# Patient Record
Sex: Male | Born: 1950 | Race: White | Hispanic: No | Marital: Single | Smoking: Former smoker
Health system: Southern US, Community
[De-identification: ages and names within clinical notes are randomized; demographics above are authoritative.]

## PROBLEM LIST (undated history)

## (undated) DIAGNOSIS — D649 Anemia, unspecified: Secondary | ICD-10-CM

## (undated) DIAGNOSIS — E119 Type 2 diabetes mellitus without complications: Secondary | ICD-10-CM

## (undated) DIAGNOSIS — I80229 Phlebitis and thrombophlebitis of unspecified popliteal vein: Secondary | ICD-10-CM

## (undated) DIAGNOSIS — I890 Lymphedema, not elsewhere classified: Secondary | ICD-10-CM

## (undated) DIAGNOSIS — R1310 Dysphagia, unspecified: Secondary | ICD-10-CM

## (undated) DIAGNOSIS — I208 Other forms of angina pectoris: Secondary | ICD-10-CM

## (undated) DIAGNOSIS — M109 Gout, unspecified: Secondary | ICD-10-CM

## (undated) DIAGNOSIS — E079 Disorder of thyroid, unspecified: Secondary | ICD-10-CM

## (undated) DIAGNOSIS — L0390 Cellulitis, unspecified: Secondary | ICD-10-CM

## (undated) DIAGNOSIS — R0600 Dyspnea, unspecified: Secondary | ICD-10-CM

## (undated) DIAGNOSIS — K922 Gastrointestinal hemorrhage, unspecified: Secondary | ICD-10-CM

## (undated) DIAGNOSIS — I872 Venous insufficiency (chronic) (peripheral): Secondary | ICD-10-CM

---

## 2007-09-13 ENCOUNTER — Encounter: Payer: Self-pay | Admitting: Internal Medicine

## 2007-10-11 ENCOUNTER — Encounter: Payer: Self-pay | Admitting: Internal Medicine

## 2007-11-11 ENCOUNTER — Encounter: Payer: Self-pay | Admitting: Internal Medicine

## 2007-12-12 ENCOUNTER — Encounter: Payer: Self-pay | Admitting: Internal Medicine

## 2008-01-09 ENCOUNTER — Encounter: Payer: Self-pay | Admitting: Internal Medicine

## 2008-02-09 ENCOUNTER — Encounter: Payer: Self-pay | Admitting: Internal Medicine

## 2009-11-08 ENCOUNTER — Ambulatory Visit: Payer: Self-pay | Admitting: Internal Medicine

## 2009-11-12 ENCOUNTER — Ambulatory Visit: Payer: Self-pay | Admitting: Internal Medicine

## 2009-11-16 ENCOUNTER — Ambulatory Visit: Payer: Self-pay | Admitting: Internal Medicine

## 2009-11-19 ENCOUNTER — Ambulatory Visit: Payer: Self-pay | Admitting: Internal Medicine

## 2009-11-23 ENCOUNTER — Ambulatory Visit: Payer: Self-pay | Admitting: Internal Medicine

## 2009-11-27 ENCOUNTER — Ambulatory Visit: Payer: Self-pay | Admitting: Internal Medicine

## 2009-11-30 ENCOUNTER — Ambulatory Visit: Payer: Self-pay | Admitting: Internal Medicine

## 2009-12-04 ENCOUNTER — Ambulatory Visit: Payer: Self-pay | Admitting: Internal Medicine

## 2009-12-07 ENCOUNTER — Ambulatory Visit: Payer: Self-pay | Admitting: Internal Medicine

## 2009-12-12 ENCOUNTER — Ambulatory Visit: Payer: Self-pay | Admitting: Internal Medicine

## 2009-12-17 ENCOUNTER — Ambulatory Visit: Payer: Self-pay | Admitting: Internal Medicine

## 2009-12-21 ENCOUNTER — Ambulatory Visit: Payer: Self-pay | Admitting: Internal Medicine

## 2009-12-24 ENCOUNTER — Ambulatory Visit: Payer: Self-pay | Admitting: Internal Medicine

## 2009-12-28 ENCOUNTER — Ambulatory Visit: Payer: Self-pay | Admitting: Internal Medicine

## 2009-12-31 ENCOUNTER — Ambulatory Visit: Payer: Self-pay | Admitting: Internal Medicine

## 2010-01-04 ENCOUNTER — Ambulatory Visit: Payer: Self-pay | Admitting: Internal Medicine

## 2010-01-07 ENCOUNTER — Ambulatory Visit: Payer: Self-pay | Admitting: Internal Medicine

## 2010-01-11 ENCOUNTER — Ambulatory Visit: Payer: Self-pay | Admitting: Internal Medicine

## 2010-01-14 ENCOUNTER — Ambulatory Visit: Payer: Self-pay | Admitting: Internal Medicine

## 2010-01-18 ENCOUNTER — Ambulatory Visit: Payer: Self-pay | Admitting: Internal Medicine

## 2010-01-21 ENCOUNTER — Ambulatory Visit: Payer: Self-pay | Admitting: Internal Medicine

## 2010-01-28 ENCOUNTER — Ambulatory Visit: Payer: Self-pay | Admitting: Internal Medicine

## 2010-02-01 ENCOUNTER — Ambulatory Visit: Payer: Self-pay | Admitting: Internal Medicine

## 2010-02-08 ENCOUNTER — Ambulatory Visit: Payer: Self-pay | Admitting: Internal Medicine

## 2010-02-15 ENCOUNTER — Ambulatory Visit: Payer: Self-pay | Admitting: Internal Medicine

## 2010-02-22 ENCOUNTER — Ambulatory Visit: Payer: Self-pay | Admitting: Internal Medicine

## 2010-02-28 ENCOUNTER — Ambulatory Visit: Payer: Self-pay | Admitting: Internal Medicine

## 2010-03-08 ENCOUNTER — Ambulatory Visit: Payer: Self-pay | Admitting: Internal Medicine

## 2010-03-15 ENCOUNTER — Ambulatory Visit: Payer: Self-pay | Admitting: Internal Medicine

## 2010-03-18 ENCOUNTER — Ambulatory Visit: Payer: Self-pay | Admitting: Internal Medicine

## 2010-03-21 ENCOUNTER — Ambulatory Visit: Payer: Self-pay | Admitting: Internal Medicine

## 2010-03-25 ENCOUNTER — Ambulatory Visit: Payer: Self-pay | Admitting: Internal Medicine

## 2010-03-29 ENCOUNTER — Ambulatory Visit: Payer: Self-pay | Admitting: Internal Medicine

## 2010-04-05 ENCOUNTER — Ambulatory Visit: Payer: Self-pay | Admitting: Internal Medicine

## 2010-04-12 ENCOUNTER — Ambulatory Visit: Payer: Self-pay | Admitting: Internal Medicine

## 2010-04-18 ENCOUNTER — Ambulatory Visit: Payer: Self-pay | Admitting: Internal Medicine

## 2010-04-26 ENCOUNTER — Ambulatory Visit: Payer: Self-pay | Admitting: Internal Medicine

## 2010-05-03 ENCOUNTER — Ambulatory Visit: Payer: Self-pay | Admitting: Internal Medicine

## 2010-05-06 ENCOUNTER — Ambulatory Visit: Payer: Self-pay | Admitting: Internal Medicine

## 2010-05-10 ENCOUNTER — Ambulatory Visit: Payer: Self-pay | Admitting: Internal Medicine

## 2010-05-17 ENCOUNTER — Ambulatory Visit: Payer: Self-pay | Admitting: Internal Medicine

## 2010-05-24 ENCOUNTER — Ambulatory Visit: Payer: Self-pay | Admitting: Internal Medicine

## 2010-05-31 ENCOUNTER — Ambulatory Visit: Payer: Self-pay | Admitting: Internal Medicine

## 2010-06-07 ENCOUNTER — Ambulatory Visit: Payer: Self-pay | Admitting: Internal Medicine

## 2010-06-14 ENCOUNTER — Ambulatory Visit: Payer: Self-pay | Admitting: Internal Medicine

## 2010-06-21 ENCOUNTER — Ambulatory Visit: Payer: Self-pay | Admitting: Internal Medicine

## 2010-06-28 ENCOUNTER — Ambulatory Visit: Payer: Self-pay | Admitting: Internal Medicine

## 2010-07-05 ENCOUNTER — Ambulatory Visit: Payer: Self-pay | Admitting: Internal Medicine

## 2010-07-12 ENCOUNTER — Ambulatory Visit: Payer: Self-pay | Admitting: Internal Medicine

## 2010-07-19 ENCOUNTER — Ambulatory Visit: Payer: Self-pay | Admitting: Internal Medicine

## 2010-07-26 ENCOUNTER — Ambulatory Visit: Payer: Self-pay | Admitting: Internal Medicine

## 2010-08-02 ENCOUNTER — Ambulatory Visit: Payer: Self-pay | Admitting: Internal Medicine

## 2010-08-09 ENCOUNTER — Ambulatory Visit: Payer: Self-pay | Admitting: Internal Medicine

## 2010-08-16 ENCOUNTER — Ambulatory Visit: Payer: Self-pay | Admitting: Internal Medicine

## 2010-08-23 ENCOUNTER — Ambulatory Visit: Payer: Self-pay | Admitting: Internal Medicine

## 2010-08-30 ENCOUNTER — Ambulatory Visit: Payer: Self-pay | Admitting: Internal Medicine

## 2010-09-06 ENCOUNTER — Ambulatory Visit: Payer: Self-pay | Admitting: Internal Medicine

## 2010-09-13 ENCOUNTER — Ambulatory Visit: Payer: Self-pay | Admitting: Internal Medicine

## 2010-09-20 ENCOUNTER — Ambulatory Visit: Payer: Self-pay | Admitting: Internal Medicine

## 2010-09-27 ENCOUNTER — Ambulatory Visit: Payer: Self-pay | Admitting: Internal Medicine

## 2010-10-07 ENCOUNTER — Ambulatory Visit: Payer: Self-pay | Admitting: Internal Medicine

## 2010-10-08 ENCOUNTER — Ambulatory Visit: Payer: Self-pay | Admitting: Internal Medicine

## 2010-10-11 ENCOUNTER — Ambulatory Visit: Payer: Self-pay | Admitting: Internal Medicine

## 2010-10-18 ENCOUNTER — Ambulatory Visit: Payer: Self-pay | Admitting: Internal Medicine

## 2010-10-25 ENCOUNTER — Ambulatory Visit: Payer: Self-pay | Admitting: Internal Medicine

## 2010-11-01 ENCOUNTER — Ambulatory Visit: Payer: Self-pay | Admitting: Internal Medicine

## 2010-11-07 ENCOUNTER — Ambulatory Visit: Payer: Self-pay

## 2011-01-09 ENCOUNTER — Encounter: Payer: Self-pay | Admitting: Cardiothoracic Surgery

## 2011-01-09 ENCOUNTER — Encounter: Payer: Self-pay | Admitting: Nurse Practitioner

## 2011-02-09 ENCOUNTER — Encounter: Payer: Self-pay | Admitting: Nurse Practitioner

## 2011-02-09 ENCOUNTER — Encounter: Payer: Self-pay | Admitting: Cardiothoracic Surgery

## 2011-03-11 ENCOUNTER — Encounter: Payer: Self-pay | Admitting: Cardiothoracic Surgery

## 2011-04-11 ENCOUNTER — Encounter: Payer: Self-pay | Admitting: Cardiothoracic Surgery

## 2011-05-11 ENCOUNTER — Encounter: Payer: Self-pay | Admitting: Cardiothoracic Surgery

## 2011-05-11 ENCOUNTER — Encounter: Payer: Self-pay | Admitting: Nurse Practitioner

## 2013-03-07 ENCOUNTER — Ambulatory Visit: Payer: Self-pay | Admitting: Ophthalmology

## 2013-03-09 ENCOUNTER — Ambulatory Visit: Payer: Self-pay | Admitting: Ophthalmology

## 2015-03-02 NOTE — Op Note (Signed)
PATIENT NAME:  Roberto Perez, Roberto Perez MR#:  [id number] DATE OF BIRTH:  08-24-1951  DATE OF PROCEDURE:  03/09/2013  PROCEDURE PERFORMED: 1.  Pars plana vitrectomy of the left eye.  2.  Gas exchange of the left eye.  3.  Endolaser of the left eye.   PREOPERATIVE DIAGNOSIS: 1.  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   POSTOPERATIVE DIAGNOSIS: 1.  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   ESTIMATED BLOOD LOSS: Less than 1 mL.   PRIMARY SURGEON:  Garlan Fair, M.D.   ANESTHESIA:  Retrobulbar block of the left eye with monitored anesthesia care.   COMPLICATIONS:  None.   INDICATIONS FOR PROCEDURE:  The patient presented to my office with decreased vision in the left eye for approximately 2 days. Examination revealed a macula-off rhegmatogenous retinal detachment. Risks, benefits and alternatives of the above procedure were discussed, and the patient wished to proceed.   DETAILS OF PROCEDURE:  After informed consent was obtained, the patient was brought into the operative suite at [facility]. The patient was placed in supine position, was given a small dose of Alfenta and a retrobulbar block was performed on the left eye by the primary surgeon without any complications. The left eye was prepped and draped in sterile manner. After lid speculum was inserted, a 25-gauge trocar was placed inferotemporally through displaced conjunctiva in an oblique fashion 3 mm beyond the limbus. The infusion cannula was turned on and inserted through the trocar and secured in position with Steri-Strips. Two more trocars were placed in a similar fashion superotemporally and superonasally. Vitreous cutter and light pipe were introduced in the eye, and a core vitrectomy was performed. The vitreous face was confirmed as already elevated, and the peripheral vitreous was trimmed for 360 degrees. A scleral depressed exam was performed and 2 retinal breaks were identified. One at 1  o'clock and another at 4 o'clock. The vitreous cutter was utilized to amputate the flaps and trim the vitreous over the area of the retinal detachment.  Endocutter was introduced and each of the breaks were marked with endocautery. A posterior draining retinotomy was created at approximately 2:30. An air-fluid exchange was performed through the posterior draining retinotomy and the retina completely flattened. Endolaser was introduced and 4 rows of laser was placed around each of the tears and the posterior draining retinotomy after any remnant fluid was removed. Endolaser was carried for 360 degrees for 3 to 4 rows posterior to the ora serrata for 360 degrees. Once completed, any remnant fluid was removed; 22% SF6 was used as an air gas exchange, and all the trocars were removed. Two wounds were noted as being leaky and closed using single 6-0 plain gut. The eye was pressurized with the 22% SF6 to achieve a pressure of approximately 15 mmHg.  5 mg of dexamethasone was given into the inferior fornix. The lid speculum was removed, and the eye was cleaned. TobraDex was placed in the eye, and a patch and shield were placed over the eye. The patient was taken to postanesthesia care with instruction to remain on his left side for 1 hour and then turned over to stay on his right side for 2 weeks.      ____________________________ Teresa Pelton. Ova Meegan, MD mfa:dmm D: 03/09/2013 10:37:00 ET T: 03/09/2013 11:18:27 ET JOB#: [id number]  cc: Teresa Pelton. Starling Manns, MD, <Dictator> Coralee Rud MD ELECTRONICALLY SIGNED 03/25/2013 13:05

## 2016-11-10 DIAGNOSIS — R262 Difficulty in walking, not elsewhere classified: Secondary | ICD-10-CM | POA: Diagnosis not present

## 2016-11-10 DIAGNOSIS — R1310 Dysphagia, unspecified: Secondary | ICD-10-CM | POA: Diagnosis not present

## 2016-11-10 DIAGNOSIS — R41841 Cognitive communication deficit: Secondary | ICD-10-CM | POA: Diagnosis not present

## 2016-11-10 DIAGNOSIS — R1313 Dysphagia, pharyngeal phase: Secondary | ICD-10-CM | POA: Diagnosis not present

## 2016-11-10 DIAGNOSIS — K7460 Unspecified cirrhosis of liver: Secondary | ICD-10-CM | POA: Diagnosis not present

## 2016-11-10 DIAGNOSIS — Z741 Need for assistance with personal care: Secondary | ICD-10-CM | POA: Diagnosis not present

## 2016-11-10 DIAGNOSIS — D509 Iron deficiency anemia, unspecified: Secondary | ICD-10-CM | POA: Diagnosis not present

## 2016-11-10 DIAGNOSIS — M6281 Muscle weakness (generalized): Secondary | ICD-10-CM | POA: Diagnosis not present

## 2016-11-10 DIAGNOSIS — R471 Dysarthria and anarthria: Secondary | ICD-10-CM | POA: Diagnosis not present

## 2016-11-11 DIAGNOSIS — R7881 Bacteremia: Secondary | ICD-10-CM | POA: Diagnosis not present

## 2016-11-11 DIAGNOSIS — K760 Fatty (change of) liver, not elsewhere classified: Secondary | ICD-10-CM | POA: Diagnosis not present

## 2016-11-11 DIAGNOSIS — I25118 Atherosclerotic heart disease of native coronary artery with other forms of angina pectoris: Secondary | ICD-10-CM | POA: Diagnosis not present

## 2016-11-11 DIAGNOSIS — D649 Anemia, unspecified: Secondary | ICD-10-CM | POA: Diagnosis not present

## 2016-11-11 DIAGNOSIS — E039 Hypothyroidism, unspecified: Secondary | ICD-10-CM | POA: Diagnosis not present

## 2016-11-11 DIAGNOSIS — I890 Lymphedema, not elsewhere classified: Secondary | ICD-10-CM | POA: Diagnosis not present

## 2016-11-11 DIAGNOSIS — I872 Venous insufficiency (chronic) (peripheral): Secondary | ICD-10-CM | POA: Diagnosis not present

## 2016-11-25 DIAGNOSIS — I4510 Unspecified right bundle-branch block: Secondary | ICD-10-CM | POA: Diagnosis not present

## 2016-11-25 DIAGNOSIS — R001 Bradycardia, unspecified: Secondary | ICD-10-CM | POA: Diagnosis not present

## 2016-11-25 DIAGNOSIS — Z79899 Other long term (current) drug therapy: Secondary | ICD-10-CM | POA: Diagnosis not present

## 2016-11-25 DIAGNOSIS — Z8619 Personal history of other infectious and parasitic diseases: Secondary | ICD-10-CM | POA: Diagnosis not present

## 2016-11-25 DIAGNOSIS — E119 Type 2 diabetes mellitus without complications: Secondary | ICD-10-CM | POA: Diagnosis not present

## 2016-11-25 DIAGNOSIS — I440 Atrioventricular block, first degree: Secondary | ICD-10-CM | POA: Diagnosis not present

## 2016-11-25 DIAGNOSIS — K760 Fatty (change of) liver, not elsewhere classified: Secondary | ICD-10-CM | POA: Diagnosis not present

## 2016-11-25 DIAGNOSIS — Z794 Long term (current) use of insulin: Secondary | ICD-10-CM | POA: Diagnosis not present

## 2016-11-25 DIAGNOSIS — I872 Venous insufficiency (chronic) (peripheral): Secondary | ICD-10-CM | POA: Diagnosis not present

## 2016-11-25 DIAGNOSIS — I8500 Esophageal varices without bleeding: Secondary | ICD-10-CM | POA: Diagnosis not present

## 2016-11-25 DIAGNOSIS — Z8249 Family history of ischemic heart disease and other diseases of the circulatory system: Secondary | ICD-10-CM | POA: Diagnosis not present

## 2016-11-25 DIAGNOSIS — E039 Hypothyroidism, unspecified: Secondary | ICD-10-CM | POA: Diagnosis not present

## 2016-11-25 DIAGNOSIS — I208 Other forms of angina pectoris: Secondary | ICD-10-CM | POA: Diagnosis not present

## 2016-11-25 DIAGNOSIS — Z87891 Personal history of nicotine dependence: Secondary | ICD-10-CM | POA: Diagnosis not present

## 2016-11-25 DIAGNOSIS — R079 Chest pain, unspecified: Secondary | ICD-10-CM | POA: Diagnosis not present

## 2016-11-25 DIAGNOSIS — Z6830 Body mass index (BMI) 30.0-30.9, adult: Secondary | ICD-10-CM | POA: Diagnosis not present

## 2016-11-25 DIAGNOSIS — E8770 Fluid overload, unspecified: Secondary | ICD-10-CM | POA: Diagnosis not present

## 2016-11-29 DIAGNOSIS — Z87891 Personal history of nicotine dependence: Secondary | ICD-10-CM | POA: Diagnosis not present

## 2016-11-29 DIAGNOSIS — D696 Thrombocytopenia, unspecified: Secondary | ICD-10-CM | POA: Diagnosis not present

## 2016-11-29 DIAGNOSIS — R140 Abdominal distension (gaseous): Secondary | ICD-10-CM | POA: Diagnosis not present

## 2016-11-29 DIAGNOSIS — E039 Hypothyroidism, unspecified: Secondary | ICD-10-CM | POA: Diagnosis not present

## 2016-11-29 DIAGNOSIS — Z794 Long term (current) use of insulin: Secondary | ICD-10-CM | POA: Diagnosis not present

## 2016-11-29 DIAGNOSIS — R161 Splenomegaly, not elsewhere classified: Secondary | ICD-10-CM | POA: Diagnosis not present

## 2016-11-29 DIAGNOSIS — L308 Other specified dermatitis: Secondary | ICD-10-CM | POA: Diagnosis not present

## 2016-11-29 DIAGNOSIS — D62 Acute posthemorrhagic anemia: Secondary | ICD-10-CM | POA: Diagnosis not present

## 2016-11-29 DIAGNOSIS — K760 Fatty (change of) liver, not elsewhere classified: Secondary | ICD-10-CM | POA: Diagnosis not present

## 2016-11-29 DIAGNOSIS — Z79899 Other long term (current) drug therapy: Secondary | ICD-10-CM | POA: Diagnosis not present

## 2016-11-29 DIAGNOSIS — R0602 Shortness of breath: Secondary | ICD-10-CM | POA: Diagnosis not present

## 2016-11-29 DIAGNOSIS — D500 Iron deficiency anemia secondary to blood loss (chronic): Secondary | ICD-10-CM | POA: Diagnosis not present

## 2016-11-29 DIAGNOSIS — D61818 Other pancytopenia: Secondary | ICD-10-CM | POA: Diagnosis not present

## 2016-11-29 DIAGNOSIS — Z6830 Body mass index (BMI) 30.0-30.9, adult: Secondary | ICD-10-CM | POA: Diagnosis not present

## 2016-11-29 DIAGNOSIS — E119 Type 2 diabetes mellitus without complications: Secondary | ICD-10-CM | POA: Diagnosis not present

## 2016-11-29 DIAGNOSIS — K7460 Unspecified cirrhosis of liver: Secondary | ICD-10-CM | POA: Diagnosis not present

## 2016-11-29 DIAGNOSIS — I10 Essential (primary) hypertension: Secondary | ICD-10-CM | POA: Diagnosis not present

## 2016-11-29 DIAGNOSIS — N179 Acute kidney failure, unspecified: Secondary | ICD-10-CM | POA: Diagnosis not present

## 2016-11-29 DIAGNOSIS — Z8249 Family history of ischemic heart disease and other diseases of the circulatory system: Secondary | ICD-10-CM | POA: Diagnosis not present

## 2016-11-29 DIAGNOSIS — D649 Anemia, unspecified: Secondary | ICD-10-CM | POA: Diagnosis not present

## 2016-11-30 DIAGNOSIS — D62 Acute posthemorrhagic anemia: Secondary | ICD-10-CM | POA: Diagnosis not present

## 2016-11-30 DIAGNOSIS — K760 Fatty (change of) liver, not elsewhere classified: Secondary | ICD-10-CM | POA: Diagnosis not present

## 2016-11-30 DIAGNOSIS — E119 Type 2 diabetes mellitus without complications: Secondary | ICD-10-CM | POA: Diagnosis not present

## 2016-11-30 DIAGNOSIS — D61818 Other pancytopenia: Secondary | ICD-10-CM | POA: Diagnosis not present

## 2016-11-30 DIAGNOSIS — Z6830 Body mass index (BMI) 30.0-30.9, adult: Secondary | ICD-10-CM | POA: Diagnosis not present

## 2016-11-30 DIAGNOSIS — R609 Edema, unspecified: Secondary | ICD-10-CM | POA: Diagnosis not present

## 2016-11-30 DIAGNOSIS — K7460 Unspecified cirrhosis of liver: Secondary | ICD-10-CM | POA: Diagnosis not present

## 2016-11-30 DIAGNOSIS — R768 Other specified abnormal immunological findings in serum: Secondary | ICD-10-CM | POA: Diagnosis not present

## 2016-11-30 DIAGNOSIS — K922 Gastrointestinal hemorrhage, unspecified: Secondary | ICD-10-CM | POA: Diagnosis not present

## 2016-11-30 DIAGNOSIS — N179 Acute kidney failure, unspecified: Secondary | ICD-10-CM | POA: Diagnosis not present

## 2016-11-30 DIAGNOSIS — D500 Iron deficiency anemia secondary to blood loss (chronic): Secondary | ICD-10-CM | POA: Diagnosis not present

## 2016-12-03 DIAGNOSIS — K760 Fatty (change of) liver, not elsewhere classified: Secondary | ICD-10-CM | POA: Diagnosis not present

## 2016-12-03 DIAGNOSIS — M6281 Muscle weakness (generalized): Secondary | ICD-10-CM | POA: Diagnosis not present

## 2016-12-03 DIAGNOSIS — R2689 Other abnormalities of gait and mobility: Secondary | ICD-10-CM | POA: Diagnosis not present

## 2016-12-03 DIAGNOSIS — D509 Iron deficiency anemia, unspecified: Secondary | ICD-10-CM | POA: Diagnosis not present

## 2016-12-03 DIAGNOSIS — Z794 Long term (current) use of insulin: Secondary | ICD-10-CM | POA: Diagnosis not present

## 2016-12-03 DIAGNOSIS — E119 Type 2 diabetes mellitus without complications: Secondary | ICD-10-CM | POA: Diagnosis not present

## 2016-12-08 DIAGNOSIS — D509 Iron deficiency anemia, unspecified: Secondary | ICD-10-CM | POA: Diagnosis not present

## 2016-12-08 DIAGNOSIS — R2689 Other abnormalities of gait and mobility: Secondary | ICD-10-CM | POA: Diagnosis not present

## 2016-12-08 DIAGNOSIS — K760 Fatty (change of) liver, not elsewhere classified: Secondary | ICD-10-CM | POA: Diagnosis not present

## 2016-12-08 DIAGNOSIS — M6281 Muscle weakness (generalized): Secondary | ICD-10-CM | POA: Diagnosis not present

## 2016-12-08 DIAGNOSIS — Z794 Long term (current) use of insulin: Secondary | ICD-10-CM | POA: Diagnosis not present

## 2016-12-08 DIAGNOSIS — E119 Type 2 diabetes mellitus without complications: Secondary | ICD-10-CM | POA: Diagnosis not present

## 2016-12-10 DIAGNOSIS — D509 Iron deficiency anemia, unspecified: Secondary | ICD-10-CM | POA: Diagnosis not present

## 2016-12-10 DIAGNOSIS — K560 Paralytic ileus: Secondary | ICD-10-CM | POA: Diagnosis not present

## 2016-12-10 DIAGNOSIS — E119 Type 2 diabetes mellitus without complications: Secondary | ICD-10-CM | POA: Diagnosis not present

## 2016-12-11 DIAGNOSIS — R2689 Other abnormalities of gait and mobility: Secondary | ICD-10-CM | POA: Diagnosis not present

## 2016-12-11 DIAGNOSIS — Z794 Long term (current) use of insulin: Secondary | ICD-10-CM | POA: Diagnosis not present

## 2016-12-11 DIAGNOSIS — M6281 Muscle weakness (generalized): Secondary | ICD-10-CM | POA: Diagnosis not present

## 2016-12-11 DIAGNOSIS — D509 Iron deficiency anemia, unspecified: Secondary | ICD-10-CM | POA: Diagnosis not present

## 2016-12-11 DIAGNOSIS — K760 Fatty (change of) liver, not elsewhere classified: Secondary | ICD-10-CM | POA: Diagnosis not present

## 2016-12-11 DIAGNOSIS — E119 Type 2 diabetes mellitus without complications: Secondary | ICD-10-CM | POA: Diagnosis not present

## 2016-12-22 DIAGNOSIS — Z87891 Personal history of nicotine dependence: Secondary | ICD-10-CM | POA: Diagnosis not present

## 2016-12-22 DIAGNOSIS — R05 Cough: Secondary | ICD-10-CM | POA: Diagnosis not present

## 2016-12-22 DIAGNOSIS — Z794 Long term (current) use of insulin: Secondary | ICD-10-CM | POA: Diagnosis not present

## 2016-12-22 DIAGNOSIS — E039 Hypothyroidism, unspecified: Secondary | ICD-10-CM | POA: Diagnosis not present

## 2016-12-22 DIAGNOSIS — J984 Other disorders of lung: Secondary | ICD-10-CM | POA: Diagnosis not present

## 2016-12-22 DIAGNOSIS — Z7984 Long term (current) use of oral hypoglycemic drugs: Secondary | ICD-10-CM | POA: Diagnosis not present

## 2016-12-22 DIAGNOSIS — Z6831 Body mass index (BMI) 31.0-31.9, adult: Secondary | ICD-10-CM | POA: Diagnosis not present

## 2016-12-22 DIAGNOSIS — D500 Iron deficiency anemia secondary to blood loss (chronic): Secondary | ICD-10-CM | POA: Diagnosis not present

## 2016-12-22 DIAGNOSIS — I10 Essential (primary) hypertension: Secondary | ICD-10-CM | POA: Diagnosis not present

## 2016-12-22 DIAGNOSIS — K7460 Unspecified cirrhosis of liver: Secondary | ICD-10-CM | POA: Diagnosis not present

## 2016-12-22 DIAGNOSIS — K760 Fatty (change of) liver, not elsewhere classified: Secondary | ICD-10-CM | POA: Diagnosis not present

## 2016-12-22 DIAGNOSIS — Z886 Allergy status to analgesic agent status: Secondary | ICD-10-CM | POA: Diagnosis not present

## 2016-12-22 DIAGNOSIS — D62 Acute posthemorrhagic anemia: Secondary | ICD-10-CM | POA: Diagnosis not present

## 2016-12-22 DIAGNOSIS — E1122 Type 2 diabetes mellitus with diabetic chronic kidney disease: Secondary | ICD-10-CM | POA: Diagnosis not present

## 2016-12-22 DIAGNOSIS — I129 Hypertensive chronic kidney disease with stage 1 through stage 4 chronic kidney disease, or unspecified chronic kidney disease: Secondary | ICD-10-CM | POA: Diagnosis not present

## 2016-12-22 DIAGNOSIS — R531 Weakness: Secondary | ICD-10-CM | POA: Diagnosis not present

## 2016-12-22 DIAGNOSIS — Z79899 Other long term (current) drug therapy: Secondary | ICD-10-CM | POA: Diagnosis not present

## 2016-12-22 DIAGNOSIS — N179 Acute kidney failure, unspecified: Secondary | ICD-10-CM | POA: Diagnosis not present

## 2016-12-22 DIAGNOSIS — E1151 Type 2 diabetes mellitus with diabetic peripheral angiopathy without gangrene: Secondary | ICD-10-CM | POA: Diagnosis not present

## 2016-12-22 DIAGNOSIS — Z8249 Family history of ischemic heart disease and other diseases of the circulatory system: Secondary | ICD-10-CM | POA: Diagnosis not present

## 2016-12-22 DIAGNOSIS — R918 Other nonspecific abnormal finding of lung field: Secondary | ICD-10-CM | POA: Diagnosis not present

## 2016-12-22 DIAGNOSIS — N189 Chronic kidney disease, unspecified: Secondary | ICD-10-CM | POA: Diagnosis not present

## 2016-12-22 DIAGNOSIS — D631 Anemia in chronic kidney disease: Secondary | ICD-10-CM | POA: Diagnosis not present

## 2016-12-22 DIAGNOSIS — D649 Anemia, unspecified: Secondary | ICD-10-CM | POA: Diagnosis not present

## 2016-12-22 DIAGNOSIS — Z86718 Personal history of other venous thrombosis and embolism: Secondary | ICD-10-CM | POA: Diagnosis not present

## 2016-12-23 DIAGNOSIS — R188 Other ascites: Secondary | ICD-10-CM | POA: Diagnosis not present

## 2016-12-23 DIAGNOSIS — D62 Acute posthemorrhagic anemia: Secondary | ICD-10-CM | POA: Diagnosis not present

## 2016-12-23 DIAGNOSIS — I4510 Unspecified right bundle-branch block: Secondary | ICD-10-CM | POA: Diagnosis not present

## 2016-12-23 DIAGNOSIS — D509 Iron deficiency anemia, unspecified: Secondary | ICD-10-CM | POA: Diagnosis not present

## 2016-12-23 DIAGNOSIS — I491 Atrial premature depolarization: Secondary | ICD-10-CM | POA: Diagnosis not present

## 2016-12-23 DIAGNOSIS — K7469 Other cirrhosis of liver: Secondary | ICD-10-CM | POA: Diagnosis not present

## 2016-12-23 DIAGNOSIS — I440 Atrioventricular block, first degree: Secondary | ICD-10-CM | POA: Diagnosis not present

## 2016-12-23 DIAGNOSIS — K760 Fatty (change of) liver, not elsewhere classified: Secondary | ICD-10-CM | POA: Diagnosis not present

## 2016-12-23 DIAGNOSIS — N189 Chronic kidney disease, unspecified: Secondary | ICD-10-CM | POA: Diagnosis not present

## 2016-12-23 DIAGNOSIS — K7460 Unspecified cirrhosis of liver: Secondary | ICD-10-CM | POA: Diagnosis not present

## 2016-12-23 DIAGNOSIS — N179 Acute kidney failure, unspecified: Secondary | ICD-10-CM | POA: Diagnosis not present

## 2016-12-24 DIAGNOSIS — D62 Acute posthemorrhagic anemia: Secondary | ICD-10-CM | POA: Diagnosis not present

## 2016-12-24 DIAGNOSIS — D509 Iron deficiency anemia, unspecified: Secondary | ICD-10-CM | POA: Diagnosis not present

## 2016-12-24 DIAGNOSIS — D649 Anemia, unspecified: Secondary | ICD-10-CM | POA: Diagnosis not present

## 2016-12-24 DIAGNOSIS — K7469 Other cirrhosis of liver: Secondary | ICD-10-CM | POA: Diagnosis not present

## 2016-12-26 DIAGNOSIS — E119 Type 2 diabetes mellitus without complications: Secondary | ICD-10-CM | POA: Diagnosis not present

## 2016-12-26 DIAGNOSIS — Z794 Long term (current) use of insulin: Secondary | ICD-10-CM | POA: Diagnosis not present

## 2016-12-26 DIAGNOSIS — K760 Fatty (change of) liver, not elsewhere classified: Secondary | ICD-10-CM | POA: Diagnosis not present

## 2016-12-26 DIAGNOSIS — D509 Iron deficiency anemia, unspecified: Secondary | ICD-10-CM | POA: Diagnosis not present

## 2016-12-26 DIAGNOSIS — M6281 Muscle weakness (generalized): Secondary | ICD-10-CM | POA: Diagnosis not present

## 2016-12-26 DIAGNOSIS — R2689 Other abnormalities of gait and mobility: Secondary | ICD-10-CM | POA: Diagnosis not present

## 2016-12-29 DIAGNOSIS — D500 Iron deficiency anemia secondary to blood loss (chronic): Secondary | ICD-10-CM | POA: Diagnosis not present

## 2016-12-29 DIAGNOSIS — R040 Epistaxis: Secondary | ICD-10-CM | POA: Diagnosis not present

## 2016-12-29 DIAGNOSIS — I10 Essential (primary) hypertension: Secondary | ICD-10-CM | POA: Diagnosis not present

## 2016-12-29 DIAGNOSIS — E119 Type 2 diabetes mellitus without complications: Secondary | ICD-10-CM | POA: Diagnosis not present

## 2016-12-29 DIAGNOSIS — Z8249 Family history of ischemic heart disease and other diseases of the circulatory system: Secondary | ICD-10-CM | POA: Diagnosis not present

## 2016-12-29 DIAGNOSIS — K921 Melena: Secondary | ICD-10-CM | POA: Diagnosis not present

## 2016-12-29 DIAGNOSIS — E039 Hypothyroidism, unspecified: Secondary | ICD-10-CM | POA: Diagnosis not present

## 2016-12-29 DIAGNOSIS — Z79899 Other long term (current) drug therapy: Secondary | ICD-10-CM | POA: Diagnosis not present

## 2016-12-29 DIAGNOSIS — R600 Localized edema: Secondary | ICD-10-CM | POA: Diagnosis not present

## 2017-01-02 DIAGNOSIS — E119 Type 2 diabetes mellitus without complications: Secondary | ICD-10-CM | POA: Diagnosis not present

## 2017-01-02 DIAGNOSIS — M6281 Muscle weakness (generalized): Secondary | ICD-10-CM | POA: Diagnosis not present

## 2017-01-02 DIAGNOSIS — Z794 Long term (current) use of insulin: Secondary | ICD-10-CM | POA: Diagnosis not present

## 2017-01-02 DIAGNOSIS — R2689 Other abnormalities of gait and mobility: Secondary | ICD-10-CM | POA: Diagnosis not present

## 2017-01-02 DIAGNOSIS — D509 Iron deficiency anemia, unspecified: Secondary | ICD-10-CM | POA: Diagnosis not present

## 2017-01-02 DIAGNOSIS — K760 Fatty (change of) liver, not elsewhere classified: Secondary | ICD-10-CM | POA: Diagnosis not present

## 2017-01-08 DIAGNOSIS — R2689 Other abnormalities of gait and mobility: Secondary | ICD-10-CM | POA: Diagnosis not present

## 2017-01-08 DIAGNOSIS — M6281 Muscle weakness (generalized): Secondary | ICD-10-CM | POA: Diagnosis not present

## 2017-01-08 DIAGNOSIS — E119 Type 2 diabetes mellitus without complications: Secondary | ICD-10-CM | POA: Diagnosis not present

## 2017-01-08 DIAGNOSIS — Z794 Long term (current) use of insulin: Secondary | ICD-10-CM | POA: Diagnosis not present

## 2017-01-08 DIAGNOSIS — D509 Iron deficiency anemia, unspecified: Secondary | ICD-10-CM | POA: Diagnosis not present

## 2017-01-08 DIAGNOSIS — K760 Fatty (change of) liver, not elsewhere classified: Secondary | ICD-10-CM | POA: Diagnosis not present

## 2017-01-09 DIAGNOSIS — E119 Type 2 diabetes mellitus without complications: Secondary | ICD-10-CM | POA: Diagnosis not present

## 2017-01-09 DIAGNOSIS — K760 Fatty (change of) liver, not elsewhere classified: Secondary | ICD-10-CM | POA: Diagnosis not present

## 2017-01-09 DIAGNOSIS — D509 Iron deficiency anemia, unspecified: Secondary | ICD-10-CM | POA: Diagnosis not present

## 2017-01-09 DIAGNOSIS — Z794 Long term (current) use of insulin: Secondary | ICD-10-CM | POA: Diagnosis not present

## 2017-01-09 DIAGNOSIS — R2689 Other abnormalities of gait and mobility: Secondary | ICD-10-CM | POA: Diagnosis not present

## 2017-01-09 DIAGNOSIS — M6281 Muscle weakness (generalized): Secondary | ICD-10-CM | POA: Diagnosis not present

## 2017-01-12 DIAGNOSIS — D509 Iron deficiency anemia, unspecified: Secondary | ICD-10-CM | POA: Diagnosis not present

## 2017-01-12 DIAGNOSIS — R2689 Other abnormalities of gait and mobility: Secondary | ICD-10-CM | POA: Diagnosis not present

## 2017-01-12 DIAGNOSIS — M6281 Muscle weakness (generalized): Secondary | ICD-10-CM | POA: Diagnosis not present

## 2017-01-12 DIAGNOSIS — K760 Fatty (change of) liver, not elsewhere classified: Secondary | ICD-10-CM | POA: Diagnosis not present

## 2017-01-12 DIAGNOSIS — Z794 Long term (current) use of insulin: Secondary | ICD-10-CM | POA: Diagnosis not present

## 2017-01-12 DIAGNOSIS — E119 Type 2 diabetes mellitus without complications: Secondary | ICD-10-CM | POA: Diagnosis not present

## 2017-01-20 DIAGNOSIS — M6281 Muscle weakness (generalized): Secondary | ICD-10-CM | POA: Diagnosis not present

## 2017-01-20 DIAGNOSIS — Z794 Long term (current) use of insulin: Secondary | ICD-10-CM | POA: Diagnosis not present

## 2017-01-20 DIAGNOSIS — D509 Iron deficiency anemia, unspecified: Secondary | ICD-10-CM | POA: Diagnosis not present

## 2017-01-20 DIAGNOSIS — K760 Fatty (change of) liver, not elsewhere classified: Secondary | ICD-10-CM | POA: Diagnosis not present

## 2017-01-20 DIAGNOSIS — E119 Type 2 diabetes mellitus without complications: Secondary | ICD-10-CM | POA: Diagnosis not present

## 2017-01-20 DIAGNOSIS — R2689 Other abnormalities of gait and mobility: Secondary | ICD-10-CM | POA: Diagnosis not present

## 2017-02-04 DIAGNOSIS — M549 Dorsalgia, unspecified: Secondary | ICD-10-CM | POA: Diagnosis not present

## 2017-02-04 DIAGNOSIS — K766 Portal hypertension: Secondary | ICD-10-CM | POA: Diagnosis not present

## 2017-02-04 DIAGNOSIS — R0602 Shortness of breath: Secondary | ICD-10-CM | POA: Diagnosis not present

## 2017-02-04 DIAGNOSIS — N179 Acute kidney failure, unspecified: Secondary | ICD-10-CM | POA: Diagnosis not present

## 2017-02-04 DIAGNOSIS — R9431 Abnormal electrocardiogram [ECG] [EKG]: Secondary | ICD-10-CM | POA: Diagnosis not present

## 2017-02-04 DIAGNOSIS — R601 Generalized edema: Secondary | ICD-10-CM | POA: Diagnosis not present

## 2017-02-04 DIAGNOSIS — N5089 Other specified disorders of the male genital organs: Secondary | ICD-10-CM | POA: Diagnosis not present

## 2017-02-04 DIAGNOSIS — N183 Chronic kidney disease, stage 3 (moderate): Secondary | ICD-10-CM | POA: Diagnosis not present

## 2017-02-04 DIAGNOSIS — D61818 Other pancytopenia: Secondary | ICD-10-CM | POA: Diagnosis not present

## 2017-02-04 DIAGNOSIS — I4510 Unspecified right bundle-branch block: Secondary | ICD-10-CM | POA: Diagnosis not present

## 2017-02-04 DIAGNOSIS — E875 Hyperkalemia: Secondary | ICD-10-CM | POA: Diagnosis not present

## 2017-02-04 DIAGNOSIS — M109 Gout, unspecified: Secondary | ICD-10-CM | POA: Diagnosis not present

## 2017-02-04 DIAGNOSIS — I129 Hypertensive chronic kidney disease with stage 1 through stage 4 chronic kidney disease, or unspecified chronic kidney disease: Secondary | ICD-10-CM | POA: Diagnosis not present

## 2017-02-04 DIAGNOSIS — K7469 Other cirrhosis of liver: Secondary | ICD-10-CM | POA: Diagnosis not present

## 2017-02-04 DIAGNOSIS — Z6832 Body mass index (BMI) 32.0-32.9, adult: Secondary | ICD-10-CM | POA: Diagnosis not present

## 2017-02-04 DIAGNOSIS — E039 Hypothyroidism, unspecified: Secondary | ICD-10-CM | POA: Diagnosis not present

## 2017-02-04 DIAGNOSIS — K760 Fatty (change of) liver, not elsewhere classified: Secondary | ICD-10-CM | POA: Diagnosis not present

## 2017-02-04 DIAGNOSIS — K7581 Nonalcoholic steatohepatitis (NASH): Secondary | ICD-10-CM | POA: Diagnosis not present

## 2017-02-04 DIAGNOSIS — Z86718 Personal history of other venous thrombosis and embolism: Secondary | ICD-10-CM | POA: Diagnosis not present

## 2017-02-04 DIAGNOSIS — R188 Other ascites: Secondary | ICD-10-CM | POA: Diagnosis not present

## 2017-02-04 DIAGNOSIS — I440 Atrioventricular block, first degree: Secondary | ICD-10-CM | POA: Diagnosis not present

## 2017-02-04 DIAGNOSIS — L89212 Pressure ulcer of right hip, stage 2: Secondary | ICD-10-CM | POA: Diagnosis not present

## 2017-02-04 DIAGNOSIS — L8945 Pressure ulcer of contiguous site of back, buttock and hip, unstageable: Secondary | ICD-10-CM | POA: Diagnosis not present

## 2017-02-04 DIAGNOSIS — D500 Iron deficiency anemia secondary to blood loss (chronic): Secondary | ICD-10-CM | POA: Diagnosis not present

## 2017-02-04 DIAGNOSIS — K922 Gastrointestinal hemorrhage, unspecified: Secondary | ICD-10-CM | POA: Diagnosis not present

## 2017-02-04 DIAGNOSIS — I872 Venous insufficiency (chronic) (peripheral): Secondary | ICD-10-CM | POA: Diagnosis not present

## 2017-02-04 DIAGNOSIS — K7290 Hepatic failure, unspecified without coma: Secondary | ICD-10-CM | POA: Diagnosis not present

## 2017-02-04 DIAGNOSIS — D72819 Decreased white blood cell count, unspecified: Secondary | ICD-10-CM | POA: Diagnosis not present

## 2017-02-04 DIAGNOSIS — I871 Compression of vein: Secondary | ICD-10-CM | POA: Diagnosis not present

## 2017-02-04 DIAGNOSIS — D649 Anemia, unspecified: Secondary | ICD-10-CM | POA: Diagnosis not present

## 2017-02-04 DIAGNOSIS — R918 Other nonspecific abnormal finding of lung field: Secondary | ICD-10-CM | POA: Diagnosis not present

## 2017-02-04 DIAGNOSIS — E1151 Type 2 diabetes mellitus with diabetic peripheral angiopathy without gangrene: Secondary | ICD-10-CM | POA: Diagnosis not present

## 2017-02-04 DIAGNOSIS — E8770 Fluid overload, unspecified: Secondary | ICD-10-CM | POA: Diagnosis not present

## 2017-02-04 DIAGNOSIS — E1122 Type 2 diabetes mellitus with diabetic chronic kidney disease: Secondary | ICD-10-CM | POA: Diagnosis not present

## 2017-02-04 DIAGNOSIS — Z794 Long term (current) use of insulin: Secondary | ICD-10-CM | POA: Diagnosis not present

## 2017-02-05 DIAGNOSIS — D649 Anemia, unspecified: Secondary | ICD-10-CM | POA: Diagnosis not present

## 2017-02-05 DIAGNOSIS — I440 Atrioventricular block, first degree: Secondary | ICD-10-CM | POA: Diagnosis not present

## 2017-02-05 DIAGNOSIS — R188 Other ascites: Secondary | ICD-10-CM | POA: Diagnosis not present

## 2017-02-05 DIAGNOSIS — K7469 Other cirrhosis of liver: Secondary | ICD-10-CM | POA: Diagnosis not present

## 2017-02-05 DIAGNOSIS — K7581 Nonalcoholic steatohepatitis (NASH): Secondary | ICD-10-CM | POA: Diagnosis not present

## 2017-02-05 DIAGNOSIS — E875 Hyperkalemia: Secondary | ICD-10-CM | POA: Diagnosis not present

## 2017-02-05 DIAGNOSIS — R601 Generalized edema: Secondary | ICD-10-CM | POA: Diagnosis not present

## 2017-02-05 DIAGNOSIS — I4510 Unspecified right bundle-branch block: Secondary | ICD-10-CM | POA: Diagnosis not present

## 2017-02-05 DIAGNOSIS — R9431 Abnormal electrocardiogram [ECG] [EKG]: Secondary | ICD-10-CM | POA: Diagnosis not present

## 2017-02-06 DIAGNOSIS — K7469 Other cirrhosis of liver: Secondary | ICD-10-CM | POA: Diagnosis not present

## 2017-02-06 DIAGNOSIS — K7581 Nonalcoholic steatohepatitis (NASH): Secondary | ICD-10-CM | POA: Diagnosis not present

## 2017-02-06 DIAGNOSIS — K7460 Unspecified cirrhosis of liver: Secondary | ICD-10-CM | POA: Diagnosis not present

## 2017-02-06 DIAGNOSIS — R601 Generalized edema: Secondary | ICD-10-CM | POA: Diagnosis not present

## 2017-02-06 DIAGNOSIS — Z9889 Other specified postprocedural states: Secondary | ICD-10-CM | POA: Diagnosis not present

## 2017-02-06 DIAGNOSIS — K766 Portal hypertension: Secondary | ICD-10-CM | POA: Diagnosis not present

## 2017-02-06 DIAGNOSIS — D509 Iron deficiency anemia, unspecified: Secondary | ICD-10-CM | POA: Diagnosis not present

## 2017-02-06 DIAGNOSIS — D649 Anemia, unspecified: Secondary | ICD-10-CM | POA: Diagnosis not present

## 2017-02-06 DIAGNOSIS — R161 Splenomegaly, not elsewhere classified: Secondary | ICD-10-CM | POA: Diagnosis not present

## 2017-02-06 DIAGNOSIS — E119 Type 2 diabetes mellitus without complications: Secondary | ICD-10-CM | POA: Diagnosis not present

## 2017-02-06 DIAGNOSIS — D500 Iron deficiency anemia secondary to blood loss (chronic): Secondary | ICD-10-CM | POA: Diagnosis not present

## 2017-02-06 DIAGNOSIS — R188 Other ascites: Secondary | ICD-10-CM | POA: Diagnosis not present

## 2017-02-06 DIAGNOSIS — K922 Gastrointestinal hemorrhage, unspecified: Secondary | ICD-10-CM | POA: Diagnosis not present

## 2017-02-07 DIAGNOSIS — D509 Iron deficiency anemia, unspecified: Secondary | ICD-10-CM | POA: Diagnosis not present

## 2017-02-07 DIAGNOSIS — K7469 Other cirrhosis of liver: Secondary | ICD-10-CM | POA: Diagnosis not present

## 2017-02-07 DIAGNOSIS — R188 Other ascites: Secondary | ICD-10-CM | POA: Diagnosis not present

## 2017-02-07 DIAGNOSIS — R601 Generalized edema: Secondary | ICD-10-CM | POA: Diagnosis not present

## 2017-02-07 DIAGNOSIS — D649 Anemia, unspecified: Secondary | ICD-10-CM | POA: Diagnosis not present

## 2017-02-07 DIAGNOSIS — K7581 Nonalcoholic steatohepatitis (NASH): Secondary | ICD-10-CM | POA: Diagnosis not present

## 2017-02-07 DIAGNOSIS — E119 Type 2 diabetes mellitus without complications: Secondary | ICD-10-CM | POA: Diagnosis not present

## 2017-02-08 DIAGNOSIS — E119 Type 2 diabetes mellitus without complications: Secondary | ICD-10-CM | POA: Diagnosis not present

## 2017-02-08 DIAGNOSIS — R188 Other ascites: Secondary | ICD-10-CM | POA: Diagnosis not present

## 2017-02-08 DIAGNOSIS — D509 Iron deficiency anemia, unspecified: Secondary | ICD-10-CM | POA: Diagnosis not present

## 2017-02-08 DIAGNOSIS — K7581 Nonalcoholic steatohepatitis (NASH): Secondary | ICD-10-CM | POA: Diagnosis not present

## 2017-02-08 DIAGNOSIS — R601 Generalized edema: Secondary | ICD-10-CM | POA: Diagnosis not present

## 2017-02-09 DIAGNOSIS — K7581 Nonalcoholic steatohepatitis (NASH): Secondary | ICD-10-CM | POA: Diagnosis not present

## 2017-02-09 DIAGNOSIS — R188 Other ascites: Secondary | ICD-10-CM | POA: Diagnosis not present

## 2017-02-09 DIAGNOSIS — D509 Iron deficiency anemia, unspecified: Secondary | ICD-10-CM | POA: Diagnosis not present

## 2017-02-09 DIAGNOSIS — E119 Type 2 diabetes mellitus without complications: Secondary | ICD-10-CM | POA: Diagnosis not present

## 2017-02-09 DIAGNOSIS — R601 Generalized edema: Secondary | ICD-10-CM | POA: Diagnosis not present

## 2017-02-10 DIAGNOSIS — Z741 Need for assistance with personal care: Secondary | ICD-10-CM | POA: Diagnosis not present

## 2017-02-10 DIAGNOSIS — E119 Type 2 diabetes mellitus without complications: Secondary | ICD-10-CM | POA: Diagnosis not present

## 2017-02-10 DIAGNOSIS — E8770 Fluid overload, unspecified: Secondary | ICD-10-CM | POA: Diagnosis not present

## 2017-02-10 DIAGNOSIS — N3000 Acute cystitis without hematuria: Secondary | ICD-10-CM | POA: Diagnosis not present

## 2017-02-10 DIAGNOSIS — D638 Anemia in other chronic diseases classified elsewhere: Secondary | ICD-10-CM | POA: Diagnosis not present

## 2017-02-10 DIAGNOSIS — R262 Difficulty in walking, not elsewhere classified: Secondary | ICD-10-CM | POA: Diagnosis not present

## 2017-02-10 DIAGNOSIS — G9340 Encephalopathy, unspecified: Secondary | ICD-10-CM | POA: Diagnosis not present

## 2017-02-10 DIAGNOSIS — N179 Acute kidney failure, unspecified: Secondary | ICD-10-CM | POA: Diagnosis not present

## 2017-02-10 DIAGNOSIS — R1310 Dysphagia, unspecified: Secondary | ICD-10-CM | POA: Diagnosis not present

## 2017-02-10 DIAGNOSIS — M6281 Muscle weakness (generalized): Secondary | ICD-10-CM | POA: Diagnosis not present

## 2017-02-10 DIAGNOSIS — R2681 Unsteadiness on feet: Secondary | ICD-10-CM | POA: Diagnosis not present

## 2017-02-10 DIAGNOSIS — K7460 Unspecified cirrhosis of liver: Secondary | ICD-10-CM | POA: Diagnosis not present

## 2017-02-10 DIAGNOSIS — D500 Iron deficiency anemia secondary to blood loss (chronic): Secondary | ICD-10-CM | POA: Diagnosis not present

## 2017-02-10 DIAGNOSIS — K766 Portal hypertension: Secondary | ICD-10-CM | POA: Diagnosis not present

## 2017-02-10 DIAGNOSIS — I872 Venous insufficiency (chronic) (peripheral): Secondary | ICD-10-CM | POA: Diagnosis not present

## 2017-02-12 ENCOUNTER — Inpatient Hospital Stay
Admission: EM | Admit: 2017-02-12 | Discharge: 2017-02-17 | DRG: 871 | Disposition: A | Discharge status: Skilled Nursing Facility | Payer: PPO | Attending: Internal Medicine | Admitting: Internal Medicine

## 2017-02-12 ENCOUNTER — Encounter: Payer: Self-pay | Admitting: Emergency Medicine

## 2017-02-12 DIAGNOSIS — A409 Streptococcal sepsis, unspecified: Secondary | ICD-10-CM | POA: Diagnosis present

## 2017-02-12 DIAGNOSIS — E039 Hypothyroidism, unspecified: Secondary | ICD-10-CM | POA: Diagnosis not present

## 2017-02-12 DIAGNOSIS — Z7401 Bed confinement status: Secondary | ICD-10-CM | POA: Diagnosis not present

## 2017-02-12 DIAGNOSIS — K7460 Unspecified cirrhosis of liver: Secondary | ICD-10-CM | POA: Diagnosis not present

## 2017-02-12 DIAGNOSIS — R609 Edema, unspecified: Secondary | ICD-10-CM

## 2017-02-12 DIAGNOSIS — K767 Hepatorenal syndrome: Secondary | ICD-10-CM | POA: Diagnosis not present

## 2017-02-12 DIAGNOSIS — G9341 Metabolic encephalopathy: Secondary | ICD-10-CM | POA: Diagnosis not present

## 2017-02-12 DIAGNOSIS — N3000 Acute cystitis without hematuria: Secondary | ICD-10-CM | POA: Diagnosis not present

## 2017-02-12 DIAGNOSIS — D631 Anemia in chronic kidney disease: Secondary | ICD-10-CM | POA: Diagnosis present

## 2017-02-12 DIAGNOSIS — A401 Sepsis due to streptococcus, group B: Secondary | ICD-10-CM | POA: Diagnosis not present

## 2017-02-12 DIAGNOSIS — I82533 Chronic embolism and thrombosis of popliteal vein, bilateral: Secondary | ICD-10-CM | POA: Diagnosis present

## 2017-02-12 DIAGNOSIS — N183 Chronic kidney disease, stage 3 (moderate): Secondary | ICD-10-CM | POA: Diagnosis present

## 2017-02-12 DIAGNOSIS — Z794 Long term (current) use of insulin: Secondary | ICD-10-CM

## 2017-02-12 DIAGNOSIS — D61818 Other pancytopenia: Secondary | ICD-10-CM | POA: Diagnosis not present

## 2017-02-12 DIAGNOSIS — K652 Spontaneous bacterial peritonitis: Secondary | ICD-10-CM | POA: Diagnosis not present

## 2017-02-12 DIAGNOSIS — R509 Fever, unspecified: Secondary | ICD-10-CM | POA: Diagnosis not present

## 2017-02-12 DIAGNOSIS — I82513 Chronic embolism and thrombosis of femoral vein, bilateral: Secondary | ICD-10-CM | POA: Diagnosis not present

## 2017-02-12 DIAGNOSIS — D638 Anemia in other chronic diseases classified elsewhere: Secondary | ICD-10-CM | POA: Diagnosis not present

## 2017-02-12 DIAGNOSIS — R1310 Dysphagia, unspecified: Secondary | ICD-10-CM | POA: Diagnosis not present

## 2017-02-12 DIAGNOSIS — N179 Acute kidney failure, unspecified: Secondary | ICD-10-CM

## 2017-02-12 DIAGNOSIS — K766 Portal hypertension: Secondary | ICD-10-CM | POA: Diagnosis present

## 2017-02-12 DIAGNOSIS — I82449 Acute embolism and thrombosis of unspecified tibial vein: Secondary | ICD-10-CM | POA: Diagnosis not present

## 2017-02-12 DIAGNOSIS — K7210 Chronic hepatic failure without coma: Secondary | ICD-10-CM

## 2017-02-12 DIAGNOSIS — L8990 Pressure ulcer of unspecified site, unspecified stage: Secondary | ICD-10-CM | POA: Diagnosis present

## 2017-02-12 DIAGNOSIS — R2681 Unsteadiness on feet: Secondary | ICD-10-CM | POA: Diagnosis not present

## 2017-02-12 DIAGNOSIS — K7290 Hepatic failure, unspecified without coma: Secondary | ICD-10-CM

## 2017-02-12 DIAGNOSIS — E875 Hyperkalemia: Secondary | ICD-10-CM | POA: Diagnosis present

## 2017-02-12 DIAGNOSIS — L309 Dermatitis, unspecified: Secondary | ICD-10-CM | POA: Diagnosis present

## 2017-02-12 DIAGNOSIS — R188 Other ascites: Secondary | ICD-10-CM

## 2017-02-12 DIAGNOSIS — K7581 Nonalcoholic steatohepatitis (NASH): Secondary | ICD-10-CM | POA: Diagnosis not present

## 2017-02-12 DIAGNOSIS — E1122 Type 2 diabetes mellitus with diabetic chronic kidney disease: Secondary | ICD-10-CM | POA: Diagnosis present

## 2017-02-12 DIAGNOSIS — N390 Urinary tract infection, site not specified: Secondary | ICD-10-CM | POA: Diagnosis not present

## 2017-02-12 DIAGNOSIS — K7469 Other cirrhosis of liver: Secondary | ICD-10-CM | POA: Diagnosis present

## 2017-02-12 DIAGNOSIS — E860 Dehydration: Secondary | ICD-10-CM | POA: Diagnosis present

## 2017-02-12 DIAGNOSIS — L97429 Non-pressure chronic ulcer of left heel and midfoot with unspecified severity: Secondary | ICD-10-CM | POA: Diagnosis not present

## 2017-02-12 DIAGNOSIS — R7881 Bacteremia: Secondary | ICD-10-CM

## 2017-02-12 DIAGNOSIS — Z79899 Other long term (current) drug therapy: Secondary | ICD-10-CM

## 2017-02-12 DIAGNOSIS — Z515 Encounter for palliative care: Secondary | ICD-10-CM

## 2017-02-12 DIAGNOSIS — G939 Disorder of brain, unspecified: Secondary | ICD-10-CM | POA: Diagnosis not present

## 2017-02-12 DIAGNOSIS — G9340 Encephalopathy, unspecified: Secondary | ICD-10-CM

## 2017-02-12 DIAGNOSIS — M6281 Muscle weakness (generalized): Secondary | ICD-10-CM | POA: Diagnosis not present

## 2017-02-12 DIAGNOSIS — E1151 Type 2 diabetes mellitus with diabetic peripheral angiopathy without gangrene: Secondary | ICD-10-CM | POA: Diagnosis present

## 2017-02-12 DIAGNOSIS — D500 Iron deficiency anemia secondary to blood loss (chronic): Secondary | ICD-10-CM | POA: Diagnosis not present

## 2017-02-12 DIAGNOSIS — Z741 Need for assistance with personal care: Secondary | ICD-10-CM | POA: Diagnosis not present

## 2017-02-12 DIAGNOSIS — A419 Sepsis, unspecified organism: Secondary | ICD-10-CM | POA: Diagnosis not present

## 2017-02-12 DIAGNOSIS — Z66 Do not resuscitate: Secondary | ICD-10-CM

## 2017-02-12 DIAGNOSIS — A4102 Sepsis due to Methicillin resistant Staphylococcus aureus: Secondary | ICD-10-CM | POA: Diagnosis not present

## 2017-02-12 DIAGNOSIS — R262 Difficulty in walking, not elsewhere classified: Secondary | ICD-10-CM | POA: Diagnosis not present

## 2017-02-12 DIAGNOSIS — D649 Anemia, unspecified: Secondary | ICD-10-CM | POA: Diagnosis not present

## 2017-02-12 DIAGNOSIS — G9349 Other encephalopathy: Secondary | ICD-10-CM | POA: Diagnosis not present

## 2017-02-12 DIAGNOSIS — M7989 Other specified soft tissue disorders: Secondary | ICD-10-CM | POA: Diagnosis not present

## 2017-02-12 DIAGNOSIS — Z87891 Personal history of nicotine dependence: Secondary | ICD-10-CM

## 2017-02-12 HISTORY — DX: Anemia, unspecified: D64.9

## 2017-02-12 LAB — GLUCOSE, CAPILLARY
GLUCOSE-CAPILLARY: 162 mg/dL — AB (ref 65–99)
GLUCOSE-CAPILLARY: 122 mg/dL — AB (ref 65–99)
GLUCOSE-CAPILLARY: 137 mg/dL — AB (ref 65–99)
Glucose-Capillary: 133 mg/dL — ABNORMAL HIGH (ref 65–99)
Glucose-Capillary: 155 mg/dL — ABNORMAL HIGH (ref 65–99)

## 2017-02-12 LAB — COMPREHENSIVE METABOLIC PANEL
ALK PHOS: 116 U/L (ref 38–126)
ALT: 13 U/L — AB (ref 17–63)
AST: 18 U/L (ref 15–41)
Albumin: 2.6 g/dL — ABNORMAL LOW (ref 3.5–5.0)
Anion gap: 7 (ref 5–15)
BUN: 78 mg/dL — ABNORMAL HIGH (ref 6–20)
CALCIUM: 8.7 mg/dL — AB (ref 8.9–10.3)
CO2: 27 mmol/L (ref 22–32)
CREATININE: 2.98 mg/dL — AB (ref 0.61–1.24)
Chloride: 103 mmol/L (ref 101–111)
GFR calc non Af Amer: 20 mL/min — ABNORMAL LOW (ref >60)
GFR, EST AFRICAN AMERICAN: 24 mL/min — AB (ref >60)
Glucose, Bld: 157 mg/dL — ABNORMAL HIGH (ref 65–99)
Potassium: 4 mmol/L (ref 3.5–5.1)
Sodium: 137 mmol/L (ref 135–145)
Total Bilirubin: 1.3 mg/dL — ABNORMAL HIGH (ref 0.3–1.2)
Total Protein: 5.9 g/dL — ABNORMAL LOW (ref 6.5–8.1)

## 2017-02-12 LAB — URINALYSIS, COMPLETE (UACMP) WITH MICROSCOPIC
BILIRUBIN URINE: NEGATIVE
GLUCOSE, UA: NEGATIVE mg/dL
KETONES UR: NEGATIVE mg/dL
NITRITE: NEGATIVE
PH: 5 (ref 5.0–8.0)
PROTEIN: 30 mg/dL — AB
Specific Gravity, Urine: 1.012 (ref 1.005–1.030)
Squamous Epithelial / HPF: NONE SEEN

## 2017-02-12 LAB — BLOOD CULTURE ID PANEL (REFLEXED)
ACINETOBACTER BAUMANNII: NOT DETECTED
CANDIDA ALBICANS: NOT DETECTED
CANDIDA KRUSEI: NOT DETECTED
CANDIDA PARAPSILOSIS: NOT DETECTED
Candida glabrata: NOT DETECTED
Candida tropicalis: NOT DETECTED
ENTEROBACTER CLOACAE COMPLEX: NOT DETECTED
ENTEROBACTERIACEAE SPECIES: NOT DETECTED
ENTEROCOCCUS SPECIES: NOT DETECTED
ESCHERICHIA COLI: NOT DETECTED
Haemophilus influenzae: NOT DETECTED
KLEBSIELLA OXYTOCA: NOT DETECTED
Klebsiella pneumoniae: NOT DETECTED
LISTERIA MONOCYTOGENES: NOT DETECTED
Methicillin resistance: DETECTED — AB
Neisseria meningitidis: NOT DETECTED
PSEUDOMONAS AERUGINOSA: NOT DETECTED
Proteus species: NOT DETECTED
SERRATIA MARCESCENS: NOT DETECTED
STAPHYLOCOCCUS AUREUS BCID: DETECTED — AB
STREPTOCOCCUS AGALACTIAE: DETECTED — AB
STREPTOCOCCUS PNEUMONIAE: NOT DETECTED
Staphylococcus species: DETECTED — AB
Streptococcus pyogenes: NOT DETECTED
Streptococcus species: DETECTED — AB

## 2017-02-12 LAB — CBC
HCT: 24 % — ABNORMAL LOW (ref 40.0–52.0)
HEMOGLOBIN: 7.7 g/dL — AB (ref 13.0–18.0)
MCH: 31.1 pg (ref 26.0–34.0)
MCHC: 32.3 g/dL (ref 32.0–36.0)
MCV: 96.2 fL (ref 80.0–100.0)
PLATELETS: 60 10*3/uL — AB (ref 150–440)
RBC: 2.49 MIL/uL — AB (ref 4.40–5.90)
RDW: 24.4 % — ABNORMAL HIGH (ref 11.5–14.5)
WBC: 3.8 10*3/uL (ref 3.8–10.6)

## 2017-02-12 LAB — LACTIC ACID, PLASMA: Lactic Acid, Venous: 1.2 mmol/L (ref 0.5–1.9)

## 2017-02-12 LAB — MRSA PCR SCREENING: MRSA by PCR: NEGATIVE

## 2017-02-12 LAB — AMMONIA: Ammonia: 163 umol/L — ABNORMAL HIGH (ref 9–35)

## 2017-02-12 LAB — PROTIME-INR
INR: 1.63
Prothrombin Time: 19.5 seconds — ABNORMAL HIGH (ref 11.4–15.2)

## 2017-02-12 MED ORDER — INSULIN ASPART 100 UNIT/ML ~~LOC~~ SOLN
0.0000 [IU] | Freq: Three times a day (TID) | SUBCUTANEOUS | Status: DC
  Administered 2017-02-12: 1 [IU] via SUBCUTANEOUS
  Administered 2017-02-12: 2 [IU] via SUBCUTANEOUS
  Administered 2017-02-12 – 2017-02-13 (×2): 1 [IU] via SUBCUTANEOUS
  Administered 2017-02-13 – 2017-02-14 (×2): 3 [IU] via SUBCUTANEOUS
  Administered 2017-02-14: 7 [IU] via SUBCUTANEOUS
  Administered 2017-02-14 – 2017-02-15 (×2): 5 [IU] via SUBCUTANEOUS
  Administered 2017-02-15 (×2): 3 [IU] via SUBCUTANEOUS
  Administered 2017-02-16: 13:00:00 7 [IU] via SUBCUTANEOUS
  Administered 2017-02-16: 08:00:00 5 [IU] via SUBCUTANEOUS
  Filled 2017-02-12: qty 3
  Filled 2017-02-12: qty 5
  Filled 2017-02-12: qty 1
  Filled 2017-02-12 (×2): qty 5
  Filled 2017-02-12: qty 7
  Filled 2017-02-12: qty 3
  Filled 2017-02-12: qty 5
  Filled 2017-02-12: qty 1
  Filled 2017-02-12: qty 3
  Filled 2017-02-12: qty 2
  Filled 2017-02-12: qty 7
  Filled 2017-02-12: qty 3
  Filled 2017-02-12: qty 1

## 2017-02-12 MED ORDER — LACTULOSE 10 GM/15ML PO SOLN: 10.0000 g | Freq: Three times a day (TID) | ORAL | Status: DC

## 2017-02-12 MED ORDER — PANTOPRAZOLE SODIUM 40 MG PO TBEC
40.0000 mg | DELAYED_RELEASE_TABLET | Freq: Two times a day (BID) | ORAL | Status: DC
  Administered 2017-02-12 – 2017-02-17 (×11): 40 mg via ORAL
  Filled 2017-02-12 (×11): qty 1

## 2017-02-12 MED ORDER — CEFTRIAXONE SODIUM-DEXTROSE 1-3.74 GM-% IV SOLR
1.0000 g | Freq: Once | INTRAVENOUS | Status: AC
  Administered 2017-02-12: 1 g via INTRAVENOUS
  Filled 2017-02-12: qty 50

## 2017-02-12 MED ORDER — LACTULOSE 10 GM/15ML PO SOLN
30.0000 g | Freq: Three times a day (TID) | ORAL | Status: DC
Start: 2017-02-12 — End: 2017-02-13
  Administered 2017-02-12 – 2017-02-13 (×3): 30 g via ORAL
  Filled 2017-02-12 (×3): qty 60

## 2017-02-12 MED ORDER — VANCOMYCIN HCL 10 G IV SOLR
1250.0000 mg | INTRAVENOUS | Status: DC
  Administered 2017-02-13 – 2017-02-14 (×2): 1250 mg via INTRAVENOUS
  Filled 2017-02-12 (×2): qty 1250

## 2017-02-12 MED ORDER — VANCOMYCIN HCL 10 G IV SOLR
1250.0000 mg | Freq: Once | INTRAVENOUS | Status: AC
  Administered 2017-02-12: 23:00:00 1250 mg via INTRAVENOUS
  Filled 2017-02-12: qty 1250

## 2017-02-12 MED ORDER — SPIRONOLACTONE 25 MG PO TABS
50.0000 mg | ORAL_TABLET | Freq: Every day | ORAL | Status: DC
  Administered 2017-02-13 – 2017-02-17 (×3): 50 mg via ORAL
  Filled 2017-02-12 (×6): qty 2

## 2017-02-12 MED ORDER — ONDANSETRON HCL 4 MG/2ML IJ SOLN
4.0000 mg | Freq: Four times a day (QID) | INTRAMUSCULAR | Status: DC | PRN
  Administered 2017-02-12: 4 mg via INTRAVENOUS
  Filled 2017-02-12: qty 2

## 2017-02-12 MED ORDER — CEFTRIAXONE SODIUM 1 G IJ SOLR
1.0000 g | INTRAMUSCULAR | Status: DC
  Administered 2017-02-13 – 2017-02-17 (×5): 1 g via INTRAVENOUS
  Filled 2017-02-12 (×6): qty 10

## 2017-02-12 MED ORDER — NYSTATIN 100000 UNIT/GM EX POWD
Freq: Three times a day (TID) | CUTANEOUS | Status: DC
  Administered 2017-02-12 – 2017-02-17 (×15): via TOPICAL
  Filled 2017-02-12: qty 15

## 2017-02-12 MED ORDER — LEVOTHYROXINE SODIUM 100 MCG PO TABS
200.0000 ug | ORAL_TABLET | ORAL | Status: DC
  Administered 2017-02-13 – 2017-02-17 (×5): 200 ug via ORAL
  Filled 2017-02-12 (×5): qty 2

## 2017-02-12 MED ORDER — LACTULOSE ENEMA
300.0000 mL | Freq: Three times a day (TID) | ORAL | Status: DC | PRN
  Filled 2017-02-12: qty 300

## 2017-02-12 MED ORDER — ADULT MULTIVITAMIN W/MINERALS CH
1.0000 | ORAL_TABLET | Freq: Every day | ORAL | Status: DC
  Administered 2017-02-12 – 2017-02-17 (×6): 1 via ORAL
  Filled 2017-02-12 (×6): qty 1

## 2017-02-12 MED ORDER — SODIUM CHLORIDE 0.9 % IV SOLN: 1.0000 g | Freq: Once | INTRAVENOUS | Status: DC

## 2017-02-12 MED ORDER — SODIUM CHLORIDE 0.9 % IV SOLN
INTRAVENOUS | Status: DC
  Administered 2017-02-12 – 2017-02-16 (×4): via INTRAVENOUS

## 2017-02-12 MED ORDER — AMMONIUM LACTATE 12 % EX LOTN
TOPICAL_LOTION | Freq: Two times a day (BID) | CUTANEOUS | Status: DC
  Administered 2017-02-12 – 2017-02-17 (×10): via TOPICAL
  Filled 2017-02-12 (×2): qty 400

## 2017-02-12 MED ORDER — DEXTROSE 5 % IV SOLN: 1.0000 g | Freq: Once | INTRAVENOUS | Status: DC

## 2017-02-12 MED ORDER — HALOPERIDOL LACTATE 5 MG/ML IJ SOLN
0.5000 mg | Freq: Three times a day (TID) | INTRAMUSCULAR | Status: DC | PRN
  Administered 2017-02-12: 17:00:00 0.5 mg via INTRAVENOUS
  Filled 2017-02-12: qty 1

## 2017-02-12 MED ORDER — CARVEDILOL 3.125 MG PO TABS
3.1250 mg | ORAL_TABLET | Freq: Two times a day (BID) | ORAL | Status: DC
  Administered 2017-02-12 – 2017-02-13 (×2): 3.125 mg via ORAL
  Filled 2017-02-12 (×2): qty 1

## 2017-02-12 MED ORDER — ONDANSETRON HCL 4 MG PO TABS: 4.0000 mg | ORAL_TABLET | Freq: Four times a day (QID) | ORAL | Status: DC | PRN

## 2017-02-12 NOTE — ED Triage Notes (Signed)
Pt arrived to the ED via EMS from [facility] for "anemia and hyperkelemia." Pt is AOx4 in no apparent distress. MD at bedside upon arrival.

## 2017-02-12 NOTE — Progress Notes (Signed)
Left message for Peggy grubs to call me 9Sister)

## 2017-02-12 NOTE — ED Notes (Signed)
Roberto Perez, (sister) at bedside requested to leave phone number for updates [phone]

## 2017-02-12 NOTE — ED Notes (Signed)
Spoke with Dr. Posey Pronto in regards to patients temperature and blood pressure. Verbal order to send patient to floor. Spoke with Duaine Dredge on floor to update her on patients plan of care.

## 2017-02-12 NOTE — ED Notes (Signed)
Family requested to leave phone number. Bonner Puna (brother) [phone].

## 2017-02-12 NOTE — ED Notes (Signed)
Spoke with Dr. Posey Pronto. She wishes to keep room assignment and wait until patient is warm to transport.

## 2017-02-12 NOTE — Progress Notes (Addendum)
PHARMACY - PHYSICIAN COMMUNICATION CRITICAL VALUE ALERT - BLOOD CULTURE IDENTIFICATION (BCID)  Results for orders placed or performed during the hospital encounter of 02/12/17  Blood Culture ID Panel (Reflexed) (Collected: 02/12/2017  5:17 AM)  Result Value Ref Range   Enterococcus species NOT DETECTED NOT DETECTED   Listeria monocytogenes NOT DETECTED NOT DETECTED   Staphylococcus species DETECTED (A) NOT DETECTED   Staphylococcus aureus DETECTED (A) NOT DETECTED   Methicillin resistance DETECTED (A) NOT DETECTED   Streptococcus species DETECTED (A) NOT DETECTED   Streptococcus agalactiae DETECTED (A) NOT DETECTED   Streptococcus pneumoniae NOT DETECTED NOT DETECTED   Streptococcus pyogenes NOT DETECTED NOT DETECTED   Acinetobacter baumannii NOT DETECTED NOT DETECTED   Enterobacteriaceae species NOT DETECTED NOT DETECTED   Enterobacter cloacae complex NOT DETECTED NOT DETECTED   Escherichia coli NOT DETECTED NOT DETECTED   Klebsiella oxytoca NOT DETECTED NOT DETECTED   Klebsiella pneumoniae NOT DETECTED NOT DETECTED   Proteus species NOT DETECTED NOT DETECTED   Serratia marcescens NOT DETECTED NOT DETECTED   Haemophilus influenzae NOT DETECTED NOT DETECTED   Neisseria meningitidis NOT DETECTED NOT DETECTED   Pseudomonas aeruginosa NOT DETECTED NOT DETECTED   Candida albicans NOT DETECTED NOT DETECTED   Candida glabrata NOT DETECTED NOT DETECTED   Candida krusei NOT DETECTED NOT DETECTED   Candida parapsilosis NOT DETECTED NOT DETECTED   Candida tropicalis NOT DETECTED NOT DETECTED    Name of physician (or Provider) Contacted: Konidena   Changes to prescribed antibiotics required: Yes, will start Vancomycin with pharmacy to dose.   4/6 04:00 lab called with result [facility]. Pt already on vanc.  Robbins,Jason D 02/12/2017  9:11 PM

## 2017-02-12 NOTE — H&P (Signed)
[place] [facility] [place] NAME: Roberto Perez    MR#:  [id number]  DATE OF BIRTH:  Jun 20, 1951  DATE OF ADMISSION:  02/12/2017  PRIMARY CARE PHYSICIAN: Gayland Curry, MD   REQUESTING/REFERRING PHYSICIAN: Dr. Owens Shark  CHIEF COMPLAINT:   Altered mental status. Patient was brought in from home. Nursing home Patient is a very poor historian though family present. HISTORY OF PRESENT ILLNESS:  Roberto Perez  is a 66 y.o. male with a known history of Severe iron deficiency anemia which is chronic has had several blood transfusions in the recent past most recent admission at [facility] from March 28 to April 3 for severe anemia where he ended up getting 7 units of blood transfusion. Patient has history of CK D stage III creatinine around 1.9, type 2 diabetes, nonalcoholic fatty liver disease/cirrhosis with portal hypertension and ascites and pancytopenia comes to the emergency room from [facility] with altered mental status. Patient was found to have temperature of 94.6 creatinine 2.98, and  UTI with abnormal urine analysis. He was also noted to have ammonia of 163  Patient is being admitted with acute encephalopathy with UTI and acute on chronic renal failure along with cirrhosis of liver with significant ascites  patient is a poor historian and confused and unable to give any history or review of system. No family in the emergency room  PAST MEDICAL HISTORY:   Past Medical History:  Diagnosis Date  . Anemia     PAST SURGICAL HISTOIRY:  History reviewed. No pertinent surgical history.  SOCIAL HISTORY:   Social History  Substance Use Topics  . Smoking status: Former Research scientist (life sciences)  . Smokeless tobacco: Never Used  . Alcohol use No    FAMILY HISTORY:  History reviewed. No pertinent family history.  DRUG ALLERGIES:  No Known Allergies  REVIEW OF SYSTEMS:  Review of Systems  Unable to perform ROS: Mental status change     MEDICATIONS AT HOME:    Prior to Admission medications   Medication Sig Start Date End Date Taking? Authorizing Provider  allopurinol (ZYLOPRIM) 300 MG tablet Take 300 mg by mouth daily. 08/09/03  Yes Historical Provider, MD  carvedilol (COREG) 3.125 MG tablet Take 3.125 mg by mouth 2 (two) times daily. 02/09/17  Yes Historical Provider, MD  glipiZIDE (GLUCOTROL XL) 10 MG 24 hr tablet Take 10 mg by mouth daily. 01/16/17  Yes Historical Provider, MD  lactulose (CHRONULAC) 10 GM/15ML solution Take 15 mLs by mouth 3 (three) times daily. 02/09/17 03/11/17 Yes Historical Provider, MD  LEVEMIR 100 UNIT/ML injection Inject 20 Units into the skin daily. 01/09/17  Yes Historical Provider, MD  levothyroxine (SYNTHROID, LEVOTHROID) 200 MCG tablet Take 200 mcg by mouth daily. 02/02/17  Yes Historical Provider, MD  pantoprazole (PROTONIX) 40 MG tablet Take 40 mg by mouth 2 (two) times daily. 02/02/17  Yes Historical Provider, MD  spironolactone (ALDACTONE) 100 MG tablet Take 50 mg by mouth daily. 01/21/17  Yes Historical Provider, MD      VITAL SIGNS:  Blood pressure (!) 109/55, pulse 63, temperature (!) 94.6 F (34.8 C), temperature source Rectal, resp. rate 18, height 6\' 2"  (1.88 m), weight 119.7 kg (264 lb), SpO2 100 %.  PHYSICAL EXAMINATION:  GENERAL:  66 y.o.-year-old patient lying in the bed with no acute distress. Appears chronically ill poorly nourished EYES: Pupils equal, round, reactive to light and accommodation. No scleral icterus. Extraocular muscles intact.  HEENT: Head atraumatic, normocephalic. Oropharynx and nasopharynx clear.  NECK:  Supple, no jugular venous distention. No thyroid enlargement, no tenderness.  LUNGS: Normal breath sounds bilaterally, no wheezing, rales,rhonchi or crepitation. No use of accessory muscles of respiration.  CARDIOVASCULAR: S1, S2 normal. No murmurs, rubs, or gallops.  ABDOMEN: Soft, nontender, severely distended. Bowel sounds present. No organomegaly or mass. Ascites+++ EXTREMITIES:  Bilateral 4+ pedal edema, chronic venous stasis changes dry skin with chronic venous ulcers above tibial shin NEUROLOGIC: Moves all extremities well. Unable to assess given encephalopathy PSYCHIATRIC: The patient is alert but significantly confused SKIN: Skin dry with chronic erythema lower extremity and venous ulcers on tibial shin   LABORATORY PANEL:   CBC  Recent Labs Lab 02/12/17 0515  WBC 3.8  HGB 7.7*  HCT 24.0*  PLT 60*   ------------------------------------------------------------------------------------------------------------------  Chemistries   Recent Labs Lab 02/12/17 0515  NA 137  K 4.0  CL 103  CO2 27  GLUCOSE 157*  BUN 78*  CREATININE 2.98*  CALCIUM 8.7*  AST 18  ALT 13*  ALKPHOS 116  BILITOT 1.3*   ------------------------------------------------------------------------------------------------------------------  Cardiac Enzymes No results for input(s): TROPONINI in the last 168 hours. ------------------------------------------------------------------------------------------------------------------  RADIOLOGY:  No results found.  EKG:    IMPRESSION AND PLAN:   Roberto Perez  is a 66 y.o. male with a known history of Severe iron deficiency anemia which is chronic has had several blood transfusions in the recent past most recent admission at [facility] from March 28 to April 3 for severe anemia where he ended up getting 7 units of blood transfusion. Patient has history of CK D stage III creatinine around 1.9, type 2 diabetes, nonalcoholic fatty liver disease/cirrhosis with portal hypertension and ascites and pancytopenia comes to the emergency room from [facility] with altered mental status.  1. Altered mental status/acute encephalopathy appears metabolic/hepatic -Ammonia of 163 -Patient has history of nonalcoholic fatty liver disease/cirrhosis of liver with complicated ascites, coagulopathy, portal hypertension, esophageal varices,  pancytopenia -Lactulose 3 times a day -Paracentesis ultrasound-guided therapeutic -IV albumin -GI consultation  2. 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 -Continue oral iron  3. Ascites/anasarca/non alcoholic fatty liver disease/cirrhosis patient has been followed up at liver clinic at [facility] resume his Lasix and spironolactone once creatinine improves  4. Acute on chronic renal failure -Multi-factorial appears hepatorenal syndrome with poor intravascular volume along with history of diabetes -We'll give IV fluids at low rate -Nephrology consultation -Baseline creatinine 1.7-1.9  5. UTI -IV Rocephin Follow-up urine cultures  6. DVT prophylaxis SCD teds Patient has chronic thrombocytopenia. Platelet Count 60,000 we'll avoid and a platelet agents.  7. Type 2 diabetes -Sliding-scale insulin  8. Patient carries a very poor prognosis given his multitude of medical problems . Palliative care consultation.  All the records are reviewed and case discussed with ED provider. Management plans discussed with the patient, family and they are in agreement.  CODE STATUS: full  TOTAL TIME TAKING CARE OF THIS PATIENT: 36minutes.    Jyll Tomaro M.D on 02/12/2017 at 7:30 AM  Between 7am to 6pm - Pager - [phone]  After 6pm go to [email or web address] - password [email or web address] [facility]  [facility]  Office  [phone]  CC: Primary care physician; Gayland Curry, MD

## 2017-02-12 NOTE — Consult Note (Signed)
Consultation Note Date: 02/12/2017   Patient Name: Roberto Perez  DOB: 1951/04/23  MRN: [id number]  Age / Sex: 66 y.o., male  PCP: Roberto Curry, MD Referring Physician: Fritzi Mandes, MD  Reason for Consultation: Establishing goals of care  HPI/Patient Profile: 66 y.o. male  with past medical history of NASH (with portal gastropathy), CKD 3 (baseline creatinine is 1.5 on 02/06/17) , DM, recurrent GIB who was admitted on 02/12/2017 with encephalopathy secondary to UTI and cirrhosis.  He also has acute on chronic kidney failure (creatinine 2.98). He is being treated with antibiotics and lactulose.  Paracentesis is being considered for relief and to evaluate for SBP.  He has had recurrent admissions at [facility] with anemia and encephalopathy. 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  He is supposed to remain on iron supplementation.  Clinical Assessment and Goals of Care: I met at bedside multiple times with the patient and his brothers Roberto Perez and Roberto Perez).  I talked on the phone with his sister Roberto Perez.  The patient is coming from [place] re[facility].  He was living at home with his brother Roberto Perez.  Roberto Perez is the medical surrogate decision maker, although he works closely with his sister Roberto Perez who has a back ground in nursing.  The patient receives his medical care at [facility].  He has relationships with multiple specialists there including hepatology, gastroenterology, hematology, vascular and nephrology.  Initially the family requested transfer to [facility].  The attending physician attempted transfer for the patient.  I talked with both Roberto Perez and Roberto Perez several times about Code status for Roberto Perez.  Both agreed that if he passed he would not want to be aggressively resuscitated.  Code status was changed to DNR.  I attempted to educate Roberto Perez and Roberto Perez about cirrhosis  and encephalopathy, but I feel it will take multiple discussions before they truly understand the basics of this chronic disease process.  Roberto Perez and I did talk about Antwone having a limited prognosis.  I hope to have further discussions with the family regarding causes of encephalopathy, prognosis and EOL expectations.    Primary Decision Maker:  NEXT OF KIN Roberto Perez and Peggy    SUMMARY OF RECOMMENDATIONS    Family needs education / reinforcement to understand the patient's progressive liver disease (and chronic kidney disease) as well as his overall poor condition.  PMT will continue to meet with them.  Code Status/Advance Care Planning:  DNR - Gold form needs to accompany him when he returns to [facility].    Symptom Management:   WOC  Lactulose (oral or rectal)  Antibiotics as directed by primary team.  Avoid all NSAIDS.  Additional Recommendations (Limitations, Scope, Preferences):  Full Scope Treatment  Palliative Prophylaxis:   Aspiration, Delirium Protocol and Palliative Wound Care   Prognosis:   Unable to determine.  Based on goals of care his prognosis is really variable.  Weeks to Months.  Discharge Planning: [facility] [place] [facility] for rehab with Palliative care service follow-up (most  likely)      Primary Diagnoses: Present on Admission: . Encephalopathies   I have reviewed the medical record, interviewed the patient and family, and examined the patient. The following aspects are pertinent.  Past Medical History:  Diagnosis Date  . Anemia    Social History   Social History  . Marital status: Single    Spouse name: N/A  . Number of children: N/A  . Years of education: N/A   Social History Main Topics  . Smoking status: Former Research scientist (life sciences)  . Smokeless tobacco: Never Used  . Alcohol use No  . Drug use: No  . Sexual activity: No   Other Topics Concern  . None   Social History Narrative  . None   History reviewed. No pertinent family  history. Scheduled Meds: . carvedilol  3.125 mg Oral BID  . [START ON 02/13/2017] cefTRIAXone (ROCEPHIN)  IV  1 g Intravenous Q24H  . insulin aspart  0-9 Units Subcutaneous TID WC  . lactulose  30 g Oral TID  . [START ON 02/13/2017] levothyroxine  200 mcg Oral BH-q7a  . multivitamin with minerals  1 tablet Oral Daily  . nystatin   Topical TID  . pantoprazole  40 mg Oral BID  . spironolactone  50 mg Oral Daily   Continuous Infusions: . sodium chloride 50 mL/hr at 02/12/17 1010   PRN Meds:.haloperidol lactate, lactulose, ondansetron **OR** ondansetron (ZOFRAN) IV No Known Allergies Review of Systems patient is encephalopathic  Physical Exam  Well develop male lying in bed.  Confused Abdomen distended and firm Extremities swollen 2+   Skin:  Wounds on scrotum and LLE  Vital Signs: BP (!) 91/45 (BP Location: Left Arm)   Pulse 64   Temp 98 F (36.7 C) (Rectal)   Resp 20   Ht _0  (1.88 m)   Wt 117.5 kg (259 lb 1.6 oz)   SpO2 98%   BMI 33.27 kg/m  Pain Assessment: No/denies pain   Pain Score: 0-No pain   SpO2: SpO2: 98 % O2 Device:SpO2: 98 % O2 Flow Rate: .O2 Flow Rate (L/min): 3.5 L/min  IO: Intake/output summary: No intake or output data in the 24 hours ending 02/12/17 1454  LBM:   Baseline Weight: Weight: 119.7 kg (264 lb) Most recent weight: Weight: 117.5 kg (259 lb 1.6 oz)     Palliative Assessment/Data:   Flowsheet Rows     Most Recent Value  Intake Tab  Referral Department  Hospitalist  Unit at Time of Referral  Med/Surg Unit  Palliative Care Primary Diagnosis  Other (Comment)  Date Notified  02/12/17  Palliative Care Type  New Palliative care  Date of Admission  02/12/17  Date first seen by Palliative Care  02/12/17  # of days Palliative referral response time  0 Day(s)  # of days IP prior to Palliative referral  0  Clinical Assessment  Palliative Performance Scale Score  30%  Psychosocial & Spiritual Assessment  Palliative Care Outcomes    Patient/Family meeting held?  Yes  Who was at the meeting?  patient and brother Roberto Perez  Palliative Care Outcomes  Improved non-pain symptom therapy, Clarified goals of care      Time In: 2:30 Time Out: 3:45 Time Total: 75 min. Greater than 50%  of this time was spent counseling and coordinating care related to the above assessment and plan.  Signed by: Imogene Burn, PA-C Palliative Medicine Pager: [phone]  Please contact Palliative Medicine Team phone at [phone] for questions and concerns.  For individual provider: See Amion              s

## 2017-02-12 NOTE — ED Notes (Signed)
Bair hugger applied to patient at this time.

## 2017-02-12 NOTE — Progress Notes (Signed)
Per Dr Posey Pronto she said she is ok with a systolic BP of 80 and above and she said she was not concerned about diastolic pressure

## 2017-02-12 NOTE — Progress Notes (Signed)
Called wound care nurse Domenic Moras finding no sacral wound on patient, left message to call me back

## 2017-02-12 NOTE — Progress Notes (Signed)
Rochele Pages brother and Trudee Grip sister [phone]   Both want patient transferred to [facility]

## 2017-02-12 NOTE — Consult Note (Signed)
[name], MD [facility]  [address] [place], [place] [address] Phone: [phone] Fax : [phone]  Consultation  Referring Provider:     Dr. Posey Pronto Primary Care Physician:  Gayland Curry, MD Primary Gastroenterologist:  Dr. at [facility]         Reason for Consultation:     Ascites with altered mental status  Date of Admission:  02/12/2017 Date of Consultation:  02/12/2017         HPI:   Roberto Perez is a 66 y.o. 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 The patient was seen by the liver transplant department in deemed not a candidate for liver transplant due to comorbidities and social situation. The patient has also had a history of severe anemia and was at [facility] at the end of last month. At that time the patient received multiple units of blood. Patient's hemoglobin on admission now is 7.7. The patient also has a history of kidney disease with an elevated creatinine. His ascites has been controlled in the past with diuretics. The patient is now being admitted with encephalopathy and a UTI with acute on chronic renal failure. The patient is not able to give much history at the present time. His also received all of his care down at [facility] in the past.  Past Medical History:  Diagnosis Date  . Anemia     History reviewed. No pertinent surgical history.  Prior to Admission medications   Medication Sig Start Date End Date Taking? Authorizing Provider  allopurinol (ZYLOPRIM) 300 MG tablet Take 300 mg by mouth daily. 08/09/03  Yes Historical Provider, MD  carvedilol (COREG) 3.125 MG tablet Take 3.125 mg by mouth 2 (two) times daily. 02/09/17  Yes Historical Provider, MD  glipiZIDE (GLUCOTROL XL) 10 MG 24 hr tablet Take 10 mg by mouth daily. 01/16/17  Yes Historical Provider, MD  lactulose (CHRONULAC) 10 GM/15ML solution Take 15 mLs by mouth 3 (three) times daily. 02/09/17 03/11/17 Yes Historical Provider, MD  LEVEMIR 100 UNIT/ML  injection Inject 20 Units into the skin daily. 01/09/17  Yes Historical Provider, MD  levothyroxine (SYNTHROID, LEVOTHROID) 200 MCG tablet Take 200 mcg by mouth daily. 02/02/17  Yes Historical Provider, MD  pantoprazole (PROTONIX) 40 MG tablet Take 40 mg by mouth 2 (two) times daily. 02/02/17  Yes Historical Provider, MD  spironolactone (ALDACTONE) 100 MG tablet Take 50 mg by mouth daily. 01/21/17  Yes Historical Provider, MD    History reviewed. No pertinent family history.   Social History  Substance Use Topics  . Smoking status: Former Research scientist (life sciences)  . Smokeless tobacco: Never Used  . Alcohol use No    Allergies as of 02/12/2017  . (No Known Allergies)    Review of Systems:    All systems reviewed and negative except where noted in HPI.   Physical Exam:  Vital signs in last 24 hours: Temp:  [94.1 F (34.5 C)-98 F (36.7 C)] 98 F (36.7 C) (04/05 1230) Pulse Rate:  [57-68] 64 (04/05 1230) Resp:  [15-23] 20 (04/05 1230) BP: (91-131)/(43-97) 91/45 (04/05 1230) SpO2:  [98 %-100 %] 98 % (04/05 1230) Weight:  [264 lb (119.7 kg)] 264 lb (119.7 kg) (04/05 0509)   General:   Confused, cooperative in NAD Head:  Normocephalic and atraumatic. Eyes:   No icterus.   Conjunctiva pink. PERRLA. Ears:  Normal auditory acuity. Neck:  Supple; no masses or thyroidomegaly Lungs: Respirations even and unlabored. Lungs clear to  auscultation bilaterally.   No wheezes, crackles, or rhonchi.  Heart:  Regular rate and rhythm;  Without murmur, clicks, rubs or gallops Abdomen:  Soft, positive distention without rebound or guarding, nontender. Normal bowel sounds. No appreciable masses or hepatomegaly.  Shifting dullness with fluid wave consistent with ascites noted.  Rectal:  Not performed. Msk:  Symmetrical without gross deformities.    Extremities:  Without edema, cyanosis or clubbing. Neurologic:  Alert and oriented x3;  grossly normal neurologically. Skin:  Intact without significant lesions or  rashes. Cervical Nodes:  No significant cervical adenopathy. Psych:   slightly lethargic.  LAB RESULTS:  Recent Labs  02/12/17 0515  WBC 3.8  HGB 7.7*  HCT 24.0*  PLT 60*   BMET  Recent Labs  02/12/17 0515  NA 137  K 4.0  CL 103  CO2 27  GLUCOSE 157*  BUN 78*  CREATININE 2.98*  CALCIUM 8.7*   LFT  Recent Labs  02/12/17 0515  PROT 5.9*  ALBUMIN 2.6*  AST 18  ALT 13*  ALKPHOS 116  BILITOT 1.3*   PT/INR  Recent Labs  02/12/17 0515  LABPROT 19.5*  INR 1.63    STUDIES: No results found.    Impression / Plan:   Roberto Perez is a 66 y.o. y/o male with Worsening encephalopathy with a history of cirrhosis and no varices back in 2017 at his last EGD. The patient has had a relatively low meld score but has been deemed not to be a candidate for liver transplant . This is due to his comorbidities and his social situation. The patient now is being treated with lactulose for his hepatic encephalopathy and antibiotics for his UTI. The patient should also have his abdomen tested for possible spontaneous bacterial peritonitis. This may be negative since the patient has already received antibiotics. I will follow the patient along with you.  Thank you for involving me in the care of this patient.      LOS: 0 days   [name], MD  02/12/2017, 1:11 PM   Note: This dictation was prepared with Dragon dictation along with smaller phrase technology. Any transcriptional errors that result from this process are unintentional.

## 2017-02-12 NOTE — Progress Notes (Addendum)
ANTIBIOTIC CONSULT NOTE - INITIAL  Pharmacy Consult for Vancomycin Indication: bacteremia  No Known Allergies  Patient Measurements: Height: 6\' 2"  (188 cm) Weight: 259 lb 1.6 oz (117.5 kg) IBW/kg (Calculated) : 82.2 Adjusted Body Weight: 96.32 kg   Vital Signs: Temp: 97.5 F (36.4 C) (04/05 2029) Temp Source: Oral (04/05 2029) BP: 103/60 (04/05 2029) Pulse Rate: 78 (04/05 2029) Intake/Output from previous day: No intake/output data recorded. Intake/Output from this shift: No intake/output data recorded.  Labs:  Recent Labs  02/12/17 0515  WBC 3.8  HGB 7.7*  PLT 60*  CREATININE 2.98*   Estimated Creatinine Clearance: 33.2 mL/min (A) (by C-G formula based on SCr of 2.98 mg/dL (H)). No results for input(s): VANCOTROUGH, VANCOPEAK, VANCORANDOM, GENTTROUGH, GENTPEAK, GENTRANDOM, TOBRATROUGH, TOBRAPEAK, TOBRARND, AMIKACINPEAK, AMIKACINTROU, AMIKACIN in the last 72 hours.   Microbiology: Recent Results (from the past 720 hour(s))  Blood culture (routine x 2)     Status: None (Preliminary result)   Collection Time: 02/12/17  5:17 AM  Result Value Ref Range Status   Specimen Description BLOOD R FA  Final   Special Requests   Final    BOTTLES DRAWN AEROBIC AND ANAEROBIC Blood Culture adequate volume   Culture  Setup Time   Final    Organism ID to follow GRAM POSITIVE COCCI AEROBIC BOTTLE ONLY CRITICAL RESULT CALLED TO, READ BACK BY AND VERIFIED WITH: Maurice Fotheringham AT 2037 02/12/2017 BY TFK    Culture GRAM POSITIVE COCCI  Final   Report Status PENDING  Incomplete  Blood culture (routine x 2)     Status: None (Preliminary result)   Collection Time: 02/12/17  5:17 AM  Result Value Ref Range Status   Specimen Description BLOOD R H  Final   Special Requests   Final    BOTTLES DRAWN AEROBIC AND ANAEROBIC Blood Culture adequate volume   Culture NO GROWTH < 12 HOURS  Final   Report Status PENDING  Incomplete  Blood Culture ID Panel (Reflexed)     Status: Abnormal    Collection Time: 02/12/17  5:17 AM  Result Value Ref Range Status   Enterococcus species NOT DETECTED NOT DETECTED Final   Listeria monocytogenes NOT DETECTED NOT DETECTED Final   Staphylococcus species DETECTED (A) NOT DETECTED Final    Comment: CRITICAL RESULT CALLED TO, READ BACK BY AND VERIFIED WITH: Alexxander Kurt AT 2037 02/12/2017 BY TFK    Staphylococcus aureus DETECTED (A) NOT DETECTED Final    Comment: Methicillin (oxacillin)-resistant Staphylococcus aureus (MRSA). MRSA is predictably resistant to beta-lactam antibiotics (except ceftaroline). Preferred therapy is vancomycin unless clinically contraindicated. Patient requires contact precautions if  hospitalized. CRITICAL RESULT CALLED TO, READ BACK BY AND VERIFIED WITH: Jelicia Nantz AT 2037 02/12/2017 BY TFK    Methicillin resistance DETECTED (A) NOT DETECTED Final    Comment: CRITICAL RESULT CALLED TO, READ BACK BY AND VERIFIED WITH: Dolores Mcgovern AT 2037 02/12/2017 BY TFK    Streptococcus species DETECTED (A) NOT DETECTED Final    Comment: CRITICAL RESULT CALLED TO, READ BACK BY AND VERIFIED WITH: Jaimee Corum AT 2037 02/12/2017 BY TFK    Streptococcus agalactiae DETECTED (A) NOT DETECTED Final    Comment: CRITICAL RESULT CALLED TO, READ BACK BY AND VERIFIED WITH: Jewett Mcgann AT 2037 02/12/2017 BY TFK    Streptococcus pneumoniae NOT DETECTED NOT DETECTED Final   Streptococcus pyogenes NOT DETECTED NOT DETECTED Final   Acinetobacter baumannii NOT DETECTED NOT DETECTED Final   Enterobacteriaceae species NOT DETECTED NOT DETECTED Final  Enterobacter cloacae complex NOT DETECTED NOT DETECTED Final   Escherichia coli NOT DETECTED NOT DETECTED Final   Klebsiella oxytoca NOT DETECTED NOT DETECTED Final   Klebsiella pneumoniae NOT DETECTED NOT DETECTED Final   Proteus species NOT DETECTED NOT DETECTED Final   Serratia marcescens NOT DETECTED NOT DETECTED Final   Haemophilus influenzae NOT DETECTED NOT DETECTED Final    Neisseria meningitidis NOT DETECTED NOT DETECTED Final   Pseudomonas aeruginosa NOT DETECTED NOT DETECTED Final   Candida albicans NOT DETECTED NOT DETECTED Final   Candida glabrata NOT DETECTED NOT DETECTED Final   Candida krusei NOT DETECTED NOT DETECTED Final   Candida parapsilosis NOT DETECTED NOT DETECTED Final   Candida tropicalis NOT DETECTED NOT DETECTED Final  MRSA PCR Screening     Status: None   Collection Time: 02/12/17  3:03 PM  Result Value Ref Range Status   MRSA by PCR NEGATIVE NEGATIVE Final    Comment:        The GeneXpert MRSA Assay (FDA approved for NASAL specimens only), is one component of a comprehensive MRSA colonization surveillance program. It is not intended to diagnose MRSA infection nor to guide or monitor treatment for MRSA infections.     Medical History: Past Medical History:  Diagnosis Date  . Anemia     Medications:  Prescriptions Prior to Admission  Medication Sig Dispense Refill Last Dose  . allopurinol (ZYLOPRIM) 300 MG tablet Take 300 mg by mouth daily.   02/11/2017 at 0800  . carvedilol (COREG) 3.125 MG tablet Take 3.125 mg by mouth 2 (two) times daily.   02/11/2017 at 2000  . glipiZIDE (GLUCOTROL XL) 10 MG 24 hr tablet Take 10 mg by mouth daily.   02/11/2017 at 0800  . lactulose (CHRONULAC) 10 GM/15ML solution Take 15 mLs by mouth 3 (three) times daily.   02/11/2017 at 2000  . LEVEMIR 100 UNIT/ML injection Inject 20 Units into the skin daily.   02/11/2017 at 0800  . levothyroxine (SYNTHROID, LEVOTHROID) 200 MCG tablet Take 200 mcg by mouth daily.   02/12/2017 at 0600  . pantoprazole (PROTONIX) 40 MG tablet Take 40 mg by mouth 2 (two) times daily.   02/11/2017 at 2000  . spironolactone (ALDACTONE) 100 MG tablet Take 50 mg by mouth daily.   02/11/2017 at 0800   Assessment: Pharmacy consulted to dose vancomycin in this 66 year old male with blood Cx growing Strep agalactiae and Staph A (Mech A +).  CrCl = 33.2 ml/min Ke = 0.032 hr-1 T1/2 = 21/7  hrs Vd = 67.4 L   Goal of Therapy:  Vancomycin trough level 15-20 mcg/ml  Plan:  Expected duration 7 days with resolution of temperature and/or normalization of WBC   Vancomycin 1250 mg IV X 1 given on 4/5 @ 22:00. Vancomycin 1250 mg IV Q24H ordered to start on 4/6 @ 04:00, ~ 6 hrs after 1st dose (stacked dosing). This pt will reach Css by 4/10 @ 10:00. Will draw 1st trough on 4/09 @ 03:30, which will be approaching Css.   Melyssa Signor D 02/12/2017,9:22 PM

## 2017-02-12 NOTE — Consult Note (Signed)
[facility] Nurse wound consult note Reason for Consult:Chronic venous insufficiency to bilateral lower legs.   Stage 3 sacrum Wound from paracentesis.   Wound type:pressure, venous and surgical Pressure Injury POA: Yes Measurement: sacrum stage 3 1 cm x 1 cm x 1 0.3 cm  Bilateral lower legs with chronic skin changes.  Follow up wound center consult scheduled 02/27/17.  Dry dressing to left flank paracentesis site.  Wound bed: pink and moist Drainage (amount, consistency, odor) moderate serous weeping.  Musty odor.  Periwound:edema Dressing procedure/placement/frequency:Cleanse bilateral lower legs with soap and water. Apply Amlactin cream twice daily.   Cleanse sacral wound with NS and pat dry.  Apply calcium alginate to wound bed.  Cover with silicone border foam dressing.  Change every other day.    Border foam dressing to paracentesis site.   Will not follow at this time.  Please re-consult if needed.  Domenic Moras RN BSN [place] Pager [phone]

## 2017-02-12 NOTE — ED Notes (Signed)
Admitting MD paged in regards to rectal temperature. Report given to 1C. Will wait to transport until patient is warm or until new room is assigned per order from admitting MD.

## 2017-02-12 NOTE — ED Provider Notes (Addendum)
[facility] Emergency Department Provider Note    First MD Initiated Contact with Patient 02/12/17 0451     (approximate)  I have reviewed the triage vital signs and the nursing notes.  History Limited secondary to altered mental status HISTORY  Chief Complaint Anemia   HPI Duwayne Matters is a 66 y.o. male presents via EMS from [facility] facility for "anemia and hyperkalemia". EMS notified us that the patient was recently discharged from [facility] 2 days ago. [place] [facility] feels nursing facility did not notify EMS staff list to the patient's hemoglobin or potassium level.   Past Medical History:  Diagnosis Date  . Anemia     Patient Active Problem List   Diagnosis Date Noted  . Encephalopathies 02/12/2017    History reviewed. No pertinent surgical history.  Prior to Admission medications   Medication Sig Start Date End Date Taking? Authorizing Provider  allopurinol (ZYLOPRIM) 300 MG tablet Take 300 mg by mouth daily. 08/09/03  Yes Historical Provider, MD  carvedilol (COREG) 3.125 MG tablet Take 3.125 mg by mouth 2 (two) times daily. 02/09/17  Yes Historical Provider, MD  glipiZIDE (GLUCOTROL XL) 10 MG 24 hr tablet Take 10 mg by mouth daily. 01/16/17  Yes Historical Provider, MD  lactulose (CHRONULAC) 10 GM/15ML solution Take 15 mLs by mouth 3 (three) times daily. 02/09/17 03/11/17 Yes Historical Provider, MD  LEVEMIR 100 UNIT/ML injection Inject 20 Units into the skin daily. 01/09/17  Yes Historical Provider, MD  levothyroxine (SYNTHROID, LEVOTHROID) 200 MCG tablet Take 200 mcg by mouth daily. 02/02/17  Yes Historical Provider, MD  pantoprazole (PROTONIX) 40 MG tablet Take 40 mg by mouth 2 (two) times daily. 02/02/17  Yes Historical Provider, MD  spironolactone (ALDACTONE) 100 MG tablet Take 50 mg by mouth daily. 01/21/17  Yes Historical Provider, MD    Allergies Patient has no known allergies.  History reviewed. No pertinent family history.  Social  History Social History  Substance Use Topics  . Smoking status: Former Research scientist (life sciences)  . Smokeless tobacco: Never Used  . Alcohol use No    Review of Systems Constitutional: No fever/chills Eyes: No visual changes. ENT: No sore throat. Cardiovascular: Denies chest pain. Respiratory: Denies shortness of breath. Gastrointestinal: No abdominal pain.  No nausea, no vomiting.  No diarrhea.  No constipation. Genitourinary: Negative for dysuria. Musculoskeletal: Negative for back pain. Skin: Negative for rash. Neurological: Negative for headaches, focal weakness or numbness.  10-point ROS otherwise negative.  ____________________________________________   PHYSICAL EXAM:  VITAL SIGNS: ED Triage Vitals  Enc Vitals Group     BP 02/12/17 0500 (!) 109/55     Pulse Rate 02/12/17 0500 63     Resp 02/12/17 0500 18     Temp 02/12/17 0500 (!) 94.6 F (34.8 C)     Temp Source 02/12/17 0500 Rectal     SpO2 02/12/17 0500 100 %     Weight 02/12/17 0509 264 lb (119.7 kg)     Height 02/12/17 0509 6\' 2"  (1.88 m)     Head Circumference --      Peak Flow --      Pain Score 02/12/17 0459 0     Pain Loc --      Pain Edu? --      Excl. in [place]? --     Constitutional: Alert but confused. Eyes: Conjunctivae are normal. Scleral icterus PERRL. EOMI. Head: Atraumatic. Mouth/Throat: Mucous membranes are moist.  Oropharynx non-erythematous. Neck: No stridor.  Cardiovascular: Normal rate, regular  rhythm. Good peripheral circulation. Grossly normal heart sounds. Respiratory: Normal respiratory effort.  No retractions. Lungs CTAB. Gastrointestinal: Distended abdomen with positive fluid wave .  Musculoskeletal: No lower extremity tenderness nor edema. No gross deformities of extremities. Neurologic: . No gross focal neurologic deficits are appreciated.  Skin: Jaundice Psychiatric: Mood and affect are normal. Speech and behavior are normal.  ____________________________________________   LABS (all labs  ordered are listed, but only abnormal results are displayed)  Labs Reviewed  CBC - Abnormal; Notable for the following:       Result Value   RBC 2.49 (*)    Hemoglobin 7.7 (*)    HCT 24.0 (*)    RDW 24.4 (*)    Platelets 60 (*)    All other components within normal limits  COMPREHENSIVE METABOLIC PANEL - Abnormal; Notable for the following:    Glucose, Bld 157 (*)    BUN 78 (*)    Creatinine, Ser 2.98 (*)    Calcium 8.7 (*)    Total Protein 5.9 (*)    Albumin 2.6 (*)    ALT 13 (*)    Total Bilirubin 1.3 (*)    GFR calc non Af Amer 20 (*)    GFR calc Af Amer 24 (*)    All other components within normal limits  AMMONIA - Abnormal; Notable for the following:    Ammonia 163 (*)    All other components within normal limits  PROTIME-INR - Abnormal; Notable for the following:    Prothrombin Time 19.5 (*)    All other components within normal limits  URINALYSIS, COMPLETE (UACMP) WITH MICROSCOPIC - Abnormal; Notable for the following:    Color, Urine YELLOW (*)    APPearance TURBID (*)    Hgb urine dipstick SMALL (*)    Protein, ur 30 (*)    Leukocytes, UA MODERATE (*)    Bacteria, UA MANY (*)    Crystals PRESENT (*)    All other components within normal limits  CULTURE, BLOOD (ROUTINE X 2)  CULTURE, BLOOD (ROUTINE X 2)  URINE CULTURE  LACTIC ACID, PLASMA   ____________________________________________  EKG  ED ECG REPORT I, Ellis N Yaden Seith, the attending physician, personally viewed and interpreted this ECG.   Date: 02/12/2017  EKG Time: 5:02 AM  Rate: 71  Rhythm: Normal sinus rhythm  Axis: Normal  Intervals: Normal  ST&T Change: None    PROCEDURES  Critical Care performed: CRITICAL CARE Performed by: Gregor Hams   Total critical care time: 45 minutes  Critical care time was exclusive of separately billable procedures and treating other patients.  Critical care was necessary to treat or prevent imminent or life-threatening deterioration.  Critical  care was time spent personally by me on the following activities: development of treatment plan with patient and/or surrogate as well as nursing, discussions with consultants, evaluation of patient's response to treatment, examination of patient, obtaining history from patient or surrogate, ordering and performing treatments and interventions, ordering and review of laboratory studies, ordering and review of radiographic studies, pulse oximetry and re-evaluation of patient's condition.   Procedure(s) performed:   Procedures   ____________________________________________   INITIAL IMPRESSION / ASSESSMENT AND PLAN / ED COURSE  Pertinent labs & imaging results that were available during my care of the patient were reviewed by me and considered in my medical decision making (see chart for details).  66 year old woman presenting to the emergency department via EMS with "anemia and hyperkalemia per nursing facility. Patient noted to be  hypothermic on arrival and concern for possible infectious etiology.. Patient confused with a distended abdomen and clear ascites concern for possible hepatic encephalopathy.      ____________________________________________  FINAL CLINICAL IMPRESSION(S) / ED DIAGNOSES  Final diagnoses:  Encephalopathy  Acute cystitis without hematuria  Sepsis   MEDICATIONS GIVEN DURING THIS VISIT:  Medications  cefTRIAXone (ROCEPHIN) IVPB 1 g (1 g Intravenous Given 02/12/17 0740)     NEW OUTPATIENT MEDICATIONS STARTED DURING THIS VISIT:  New Prescriptions   No medications on file    Modified Medications   No medications on file    Discontinued Medications   No medications on file     Note:  This document was prepared using Dragon voice recognition software and may include unintentional dictation errors.    Gregor Hams, MD 02/12/17 [place], MD 02/12/17 [phone]

## 2017-02-13 ENCOUNTER — Inpatient Hospital Stay (HOSPITAL_COMMUNITY)
Admit: 2017-02-13 | Discharge: 2017-02-13 | Disposition: A | Discharge status: Home / Self Care | Payer: PPO | Attending: Infectious Diseases | Admitting: Infectious Diseases

## 2017-02-13 ENCOUNTER — Inpatient Hospital Stay: Payer: PPO

## 2017-02-13 DIAGNOSIS — R509 Fever, unspecified: Secondary | ICD-10-CM

## 2017-02-13 LAB — BODY FLUID CELL COUNT WITH DIFFERENTIAL
LYMPHS FL: 8 %
MONOCYTE-MACROPHAGE-SEROUS FLUID: 21 %
Neutrophil Count, Fluid: 71 %
Total Nucleated Cell Count, Fluid: 2967 cu mm

## 2017-02-13 LAB — BASIC METABOLIC PANEL
Anion gap: 8 (ref 5–15)
BUN: 73 mg/dL — AB (ref 6–20)
CO2: 25 mmol/L (ref 22–32)
CREATININE: 2.71 mg/dL — AB (ref 0.61–1.24)
Calcium: 8.7 mg/dL — ABNORMAL LOW (ref 8.9–10.3)
Chloride: 111 mmol/L (ref 101–111)
GFR calc Af Amer: 27 mL/min — ABNORMAL LOW (ref >60)
GFR calc non Af Amer: 23 mL/min — ABNORMAL LOW (ref >60)
GLUCOSE: 116 mg/dL — AB (ref 65–99)
Potassium: 3.8 mmol/L (ref 3.5–5.1)
Sodium: 144 mmol/L (ref 135–145)

## 2017-02-13 LAB — GLUCOSE, CAPILLARY
GLUCOSE-CAPILLARY: 150 mg/dL — AB (ref 65–99)
Glucose-Capillary: 119 mg/dL — ABNORMAL HIGH (ref 65–99)
Glucose-Capillary: 231 mg/dL — ABNORMAL HIGH (ref 65–99)
Glucose-Capillary: 271 mg/dL — ABNORMAL HIGH (ref 65–99)

## 2017-02-13 LAB — URINE CULTURE

## 2017-02-13 LAB — PATHOLOGIST SMEAR REVIEW

## 2017-02-13 LAB — ECHOCARDIOGRAM COMPLETE
Height: 74 in
WEIGHTICAEL: 4145.6 [oz_av]

## 2017-02-13 LAB — AMMONIA: Ammonia: 68 umol/L — ABNORMAL HIGH (ref 9–35)

## 2017-02-13 LAB — PROTEIN / CREATININE RATIO, URINE
CREATININE, URINE: 77 mg/dL
Protein Creatinine Ratio: 0.27 mg/mg{Cre} — ABNORMAL HIGH (ref 0.00–0.15)
Total Protein, Urine: 21 mg/dL

## 2017-02-13 LAB — PHOSPHORUS: PHOSPHORUS: 4.3 mg/dL (ref 2.5–4.6)

## 2017-02-13 LAB — ALBUMIN, PLEURAL OR PERITONEAL FLUID: Albumin, Fluid: <1 g/dL

## 2017-02-13 MED ORDER — LACTULOSE 10 GM/15ML PO SOLN
10.0000 g | Freq: Three times a day (TID) | ORAL | Status: DC
  Administered 2017-02-13 (×2): 10 g via ORAL
  Administered 2017-02-14: 20 g via ORAL
  Filled 2017-02-13 (×3): qty 30

## 2017-02-13 NOTE — Consult Note (Signed)
[facility] CONSULT NOTE    Date: 02/13/2017                  Patient Name:  Roberto Perez  MRN: [id number]  DOB: 04-15-1951  Age / Sex: 66 y.o., male         PCP: Gayland Curry, MD                 Service Requesting Consult: Hospitalist                 Reason for Consult: Acute renal failure, CKD stage III            History of Present Illness: Patient is a 66 y.o. male with a PMHx of Diabetes mellitus type 2, cirrhosis, hypothyroidism, ascites, nonalcoholic fatty liver disease, iron deficiency anemia, chronic kidney disease stage III, who was admitted to [facility] on 02/12/2017 for evaluation of altered mental status. The patient is a poor historian and cannot offer significant details regarding history of present illness. He was recently admitted to [facility]. He was then discharged to a nursing home.  He was admitted to [facility] for 02/04/2017 to 02/10/2017. At that time he presented with volume overload.  Unclear if he had good by mouth intake after presenting to the nursing home.  Patient has underlying chronic kidney disease with a baseline EGFR of 35. BUN currently up to 73 with a creatinine of 2.7.  He has been started on ceftriaxone for urinary tract infection as well as vancomycin for Staphylococcus aureus sepsis.   Medications: Outpatient medications: Prescriptions Prior to Admission  Medication Sig Dispense Refill Last Dose  . allopurinol (ZYLOPRIM) 300 MG tablet Take 300 mg by mouth daily.   02/11/2017 at 0800  . carvedilol (COREG) 3.125 MG tablet Take 3.125 mg by mouth 2 (two) times daily.   02/11/2017 at 2000  . glipiZIDE (GLUCOTROL XL) 10 MG 24 hr tablet Take 10 mg by mouth daily.   02/11/2017 at 0800  . lactulose (CHRONULAC) 10 GM/15ML solution Take 15 mLs by mouth 3 (three) times daily.   02/11/2017 at 2000  . LEVEMIR 100 UNIT/ML injection Inject 20 Units into the skin daily.   02/11/2017 at 0800  . levothyroxine (SYNTHROID, LEVOTHROID) 200 MCG tablet Take  200 mcg by mouth daily.   02/12/2017 at 0600  . pantoprazole (PROTONIX) 40 MG tablet Take 40 mg by mouth 2 (two) times daily.   02/11/2017 at 2000  . spironolactone (ALDACTONE) 100 MG tablet Take 50 mg by mouth daily.   02/11/2017 at 0800    Current medications: Current Facility-Administered Medications  Medication Dose Route Frequency Provider Last Rate Last Dose  . 0.9 %  sodium chloride infusion   Intravenous Continuous Fritzi Mandes, MD 50 mL/hr at 02/12/17 1010    . ammonium lactate (LAC-HYDRIN) 12 % lotion   Topical BID Fritzi Mandes, MD      . carvedilol (COREG) tablet 3.125 mg  3.125 mg Oral BID Fritzi Mandes, MD   3.125 mg at 02/12/17 2236  . cefTRIAXone (ROCEPHIN) 1 g in dextrose 5 % 50 mL IVPB  1 g Intravenous Q24H Fritzi Mandes, MD   1 g at 02/13/17 0824  . haloperidol lactate (HALDOL) injection 0.5 mg  0.5 mg Intravenous TID PRN Fritzi Mandes, MD   0.5 mg at 02/12/17 1647  . insulin aspart (novoLOG) injection 0-9 Units  0-9 Units Subcutaneous TID WC Fritzi Mandes, MD   1 Units at 02/12/17 1803  . lactulose (  CHRONULAC) 10 GM/15ML solution 30 g  30 g Oral TID Fritzi Mandes, MD   30 g at 02/12/17 2237  . lactulose (CHRONULAC) enema 200 gm  300 mL Rectal TID PRN Melton Alar, PA-C      . levothyroxine (SYNTHROID, LEVOTHROID) tablet 200 mcg  200 mcg Oral Weber Cooks, MD   200 mcg at 02/13/17 0824  . multivitamin with minerals tablet 1 tablet  1 tablet Oral Daily Fritzi Mandes, MD   1 tablet at 02/12/17 1308  . nystatin (MYCOSTATIN/NYSTOP) topical powder   Topical TID Melton Alar, PA-C      . ondansetron [facility]) tablet 4 mg  4 mg Oral Q6H PRN Fritzi Mandes, MD       Or  . ondansetron (ZOFRAN) injection 4 mg  4 mg Intravenous Q6H PRN Fritzi Mandes, MD   4 mg at 02/12/17 1240  . pantoprazole (PROTONIX) EC tablet 40 mg  40 mg Oral BID Fritzi Mandes, MD   40 mg at 02/12/17 2242  . spironolactone (ALDACTONE) tablet 50 mg  50 mg Oral Daily Fritzi Mandes, MD      . vancomycin (VANCOCIN) 1,250 mg in sodium chloride 0.9 %  250 mL IVPB  1,250 mg Intravenous Q24H Fritzi Mandes, MD   1,250 mg at 02/13/17 0413      Allergies: No Known Allergies    Past Medical History: Past Medical History:  Diagnosis Date  . Anemia      Past Surgical History: History reviewed. No pertinent surgical history.   Family History: History reviewed. No pertinent family history.   Social History: Social History   Social History  . Marital status: Single    Spouse name: N/A  . Number of children: N/A  . Years of education: N/A   Occupational History  . Not on file.   Social History Main Topics  . Smoking status: Former Research scientist (life sciences)  . Smokeless tobacco: Never Used  . Alcohol use No  . Drug use: No  . Sexual activity: No   Other Topics Concern  . Not on file   Social History Narrative  . No narrative on file     Review of Systems: Patient unable to provide reliable review of systems.  Vital Signs: Blood pressure (!) 108/56, pulse 74, temperature 97.5 F (36.4 C), temperature source Oral, resp. rate 18, height '6\' 2"'$  (1.88 m), weight 117.5 kg (259 lb 1.6 oz), SpO2 95 %.  Weight trends: Filed Weights   02/12/17 0509 02/12/17 1230  Weight: 119.7 kg (264 lb) 117.5 kg (259 lb 1.6 oz)    Physical Exam: General: Chronically ill appearing  Head: Normocephalic, atraumatic.  Eyes: Anicteric, EOMI  Nose: Mucous membranes dry, not inflammed, nonerythematous.  Throat: Oropharynx nonerythematous, no exudate appreciated.   Neck: Supple, trachea midline.  Lungs:  Normal respiratory effort. Bilateral rhonchi  Heart: S1S2 no rubs  Abdomen:  Soft, NT, mild distension noted  Extremities: 1+ b/l LE edema  Neurologic: Lethargic but arousable  Skin: Scaly skin noted on b/l LE's    Lab results: Basic Metabolic Panel:  Recent Labs Lab 02/12/17 0515 02/13/17 0520  NA 137 144  K 4.0 3.8  CL 103 111  CO2 27 25  GLUCOSE 157* 116*  BUN 78* 73*  CREATININE 2.98* 2.71*  CALCIUM 8.7* 8.7*    Liver Function  Tests:  Recent Labs Lab 02/12/17 0515  AST 18  ALT 13*  ALKPHOS 116  BILITOT 1.3*  PROT 5.9*  ALBUMIN 2.6*  No results for input(s): LIPASE, AMYLASE in the last 168 hours.  Recent Labs Lab 02/12/17 0515 02/13/17 0520  AMMONIA 163* 68*    CBC:  Recent Labs Lab 02/12/17 0515  WBC 3.8  HGB 7.7*  HCT 24.0*  MCV 96.2  PLT 60*    Cardiac Enzymes: No results for input(s): CKTOTAL, CKMB, CKMBINDEX, TROPONINI in the last 168 hours.  BNP: Invalid input(s): POCBNP  CBG:  Recent Labs Lab 02/12/17 1300 02/12/17 1656 02/12/17 1743 02/12/17 2108 02/13/17 0736  GLUCAP 122* 133* 137* 155* 119*    Microbiology: Results for orders placed or performed during the hospital encounter of 02/12/17  Blood culture (routine x 2)     Status: None (Preliminary result)   Collection Time: 02/12/17  5:17 AM  Result Value Ref Range Status   Specimen Description BLOOD R FA  Final   Special Requests   Final    BOTTLES DRAWN AEROBIC AND ANAEROBIC Blood Culture adequate volume   Culture  Setup Time   Final    Organism ID to follow GRAM POSITIVE COCCI IN BOTH AEROBIC AND ANAEROBIC BOTTLES CRITICAL RESULT CALLED TO, READ BACK BY AND VERIFIED WITH: JASON ROBBINS AT 2037 02/12/2017 BY TFK MATT MCBANE @ 7209 ON 02/13/2017 BY CAF    Culture GRAM POSITIVE COCCI  Final   Report Status PENDING  Incomplete  Blood culture (routine x 2)     Status: None (Preliminary result)   Collection Time: 02/12/17  5:17 AM  Result Value Ref Range Status   Specimen Description BLOOD R H  Final   Special Requests   Final    BOTTLES DRAWN AEROBIC AND ANAEROBIC Blood Culture adequate volume   Culture  Setup Time   Final    GRAM POSITIVE COCCI ANAEROBIC BOTTLE ONLY CRITICAL RESULT CALLED TO, READ BACK BY AND VERIFIED WITH: MATT MCBANE @ 4709 ON 02/13/2017 BY CAF    Culture GRAM POSITIVE COCCI  Final   Report Status PENDING  Incomplete  Urine culture     Status: Abnormal   Collection Time: 02/12/17  5:17  AM  Result Value Ref Range Status   Specimen Description URINE, RANDOM  Final   Special Requests NONE  Final   Culture MULTIPLE SPECIES PRESENT, SUGGEST RECOLLECTION (A)  Final   Report Status 02/13/2017 FINAL  Final  Blood Culture ID Panel (Reflexed)     Status: Abnormal   Collection Time: 02/12/17  5:17 AM  Result Value Ref Range Status   Enterococcus species NOT DETECTED NOT DETECTED Final   Listeria monocytogenes NOT DETECTED NOT DETECTED Final   Staphylococcus species DETECTED (A) NOT DETECTED Final    Comment: CRITICAL RESULT CALLED TO, READ BACK BY AND VERIFIED WITH: JASON ROBBINS AT 2037 02/12/2017 BY TFK    Staphylococcus aureus DETECTED (A) NOT DETECTED Final    Comment: Methicillin (oxacillin)-resistant Staphylococcus aureus (MRSA). MRSA is predictably resistant to beta-lactam antibiotics (except ceftaroline). Preferred therapy is vancomycin unless clinically contraindicated. Patient requires contact precautions if  hospitalized. CRITICAL RESULT CALLED TO, READ BACK BY AND VERIFIED WITH: JASON ROBBINS AT 2037 02/12/2017 BY TFK    Methicillin resistance DETECTED (A) NOT DETECTED Final    Comment: CRITICAL RESULT CALLED TO, READ BACK BY AND VERIFIED WITH: JASON ROBBINS AT 2037 02/12/2017 BY TFK    Streptococcus species DETECTED (A) NOT DETECTED Final    Comment: CRITICAL RESULT CALLED TO, READ BACK BY AND VERIFIED WITH: JASON ROBBINS AT 2037 02/12/2017 BY TFK    Streptococcus agalactiae DETECTED (A)  NOT DETECTED Final    Comment: CRITICAL RESULT CALLED TO, READ BACK BY AND VERIFIED WITH: JASON ROBBINS AT 2037 02/12/2017 BY TFK    Streptococcus pneumoniae NOT DETECTED NOT DETECTED Final   Streptococcus pyogenes NOT DETECTED NOT DETECTED Final   Acinetobacter baumannii NOT DETECTED NOT DETECTED Final   Enterobacteriaceae species NOT DETECTED NOT DETECTED Final   Enterobacter cloacae complex NOT DETECTED NOT DETECTED Final   Escherichia coli NOT DETECTED NOT DETECTED Final    Klebsiella oxytoca NOT DETECTED NOT DETECTED Final   Klebsiella pneumoniae NOT DETECTED NOT DETECTED Final   Proteus species NOT DETECTED NOT DETECTED Final   Serratia marcescens NOT DETECTED NOT DETECTED Final   Haemophilus influenzae NOT DETECTED NOT DETECTED Final   Neisseria meningitidis NOT DETECTED NOT DETECTED Final   Pseudomonas aeruginosa NOT DETECTED NOT DETECTED Final   Candida albicans NOT DETECTED NOT DETECTED Final   Candida glabrata NOT DETECTED NOT DETECTED Final   Candida krusei NOT DETECTED NOT DETECTED Final   Candida parapsilosis NOT DETECTED NOT DETECTED Final   Candida tropicalis NOT DETECTED NOT DETECTED Final  MRSA PCR Screening     Status: None   Collection Time: 02/12/17  3:03 PM  Result Value Ref Range Status   MRSA by PCR NEGATIVE NEGATIVE Final    Comment:        The GeneXpert MRSA Assay (FDA approved for NASAL specimens only), is one component of a comprehensive MRSA colonization surveillance program. It is not intended to diagnose MRSA infection nor to guide or monitor treatment for MRSA infections.     Coagulation Studies:  Recent Labs  02/12/17 0515  LABPROT 19.5*  INR 1.63    Urinalysis:  Recent Labs  02/12/17 0517  COLORURINE YELLOW*  LABSPEC 1.012  PHURINE 5.0  GLUCOSEU NEGATIVE  HGBUR SMALL*  BILIRUBINUR NEGATIVE  KETONESUR NEGATIVE  PROTEINUR 30*  NITRITE NEGATIVE  LEUKOCYTESUR MODERATE*      Imaging:  No results found.   Assessment & Plan: Pt is a 66 y.o. male with a PMHx of Diabetes mellitus type 2, cirrhosis, hypothyroidism, ascites, nonalcoholic fatty liver disease, iron deficiency anemia, chronic kidney disease stage III, who was admitted to [facility] on 02/12/2017 for evaluation of altered mental status.   1.  Acute renal failure, likely due to poor po intake, sepsis, UTI. 2.  Chronic kidney disease stage III baseline egfr 35. 3.  Sepsis, with MRSA and strep. 4.  Anemia of CKD.  5.  Cirrhosis of the  liver.  Plan:  We are asked to see the patient for evaluation management of acute renal failure. His acute renal failure secondary to poor by mouth intake, sepsis, and possible urinary tract infection.  For now continue 0.9 normal saline at 50 cc per hour. We will also obtain renal ultrasound, SPEP, UPEP, ANA, ANCA antibodies, GBM antibodies, C3, and C4. Continue to treat sepsis with vancomycin. Consider stopping ceftriaxone but defer to primary team. In addition he is significantly anemic. Recommend blood transfusion for hemoglobin of 7 or less. Overall patient has very guarded prognosis.

## 2017-02-13 NOTE — NC FL2 (Signed)
[place] LEVEL OF CARE SCREENING TOOL     IDENTIFICATION  Patient Name: Roberto Perez Birthdate: 1951-08-28 Sex: male Admission Date (Current Location): 02/12/2017  [place] and [place] Number:  Engineering geologist and Address:  [facility], [address], [place], [place] [address]      Provider Number: [id number]  Attending Physician Name and Address:  Fritzi Mandes, MD  Relative Name and Phone Number:  [place], [place] [phone]     Current Level of Care: Hospital Recommended Level of Care: [place] Prior Approval Number:    Date Approved/Denied:   PASRR Number: [id number]  Discharge Plan: SNF    Current Diagnoses: Patient Active Problem List   Diagnosis Date Noted  . Encephalopathy 02/12/2017  . Ascites   . End stage liver disease ([place])   . Palliative care encounter   . DNR (do not resuscitate)     Orientation RESPIRATION BLADDER Height & Weight     Self, Place  Normal Continent Weight: 259 lb 1.6 oz (117.5 kg) Height:  6\' 2"  (188 cm)  BEHAVIORAL SYMPTOMS/MOOD NEUROLOGICAL BOWEL NUTRITION STATUS      Incontinent Diet (2G sodium)  AMBULATORY STATUS COMMUNICATION OF NEEDS Skin   Limited Assist Verbally Surgical wounds                       Personal Care Assistance Level of Assistance  Bathing, Feeding, Dressing Bathing Assistance: Limited assistance Feeding assistance: Limited assistance Dressing Assistance: Limited assistance     Functional Limitations Info  Sight, Hearing, Speech Sight Info: Adequate Hearing Info: Adequate Speech Info: Adequate    SPECIAL CARE FACTORS FREQUENCY  PT (By licensed PT), Speech therapy     PT Frequency: 5x a week              Contractures Contractures Info: Not present    Additional Factors Info  Code Status, Allergies, Insulin Sliding Scale Code Status Info: DNR Allergies Info: NKA   Insulin Sliding Scale Info: insulin aspart (novoLOG)  injection 0-9 Units 3x a day with meals       Current Medications (02/13/2017):  This is the current hospital active medication list Current Facility-Administered Medications  Medication Dose Route Frequency Provider Last Rate Last Dose  . 0.9 %  sodium chloride infusion   Intravenous Continuous Fritzi Mandes, MD 50 mL/hr at 02/13/17 1200    . ammonium lactate (LAC-HYDRIN) 12 % lotion   Topical BID Fritzi Mandes, MD      . carvedilol (COREG) tablet 3.125 mg  3.125 mg Oral BID Fritzi Mandes, MD   3.125 mg at 02/13/17 1201  . cefTRIAXone (ROCEPHIN) 1 g in dextrose 5 % 50 mL IVPB  1 g Intravenous Q24H Fritzi Mandes, MD   1 g at 02/13/17 0824  . haloperidol lactate (HALDOL) injection 0.5 mg  0.5 mg Intravenous TID PRN Fritzi Mandes, MD   0.5 mg at 02/12/17 1647  . insulin aspart (novoLOG) injection 0-9 Units  0-9 Units Subcutaneous TID WC Fritzi Mandes, MD   1 Units at 02/13/17 1213  . lactulose (CHRONULAC) 10 GM/15ML solution 10 g  10 g Oral TID Fritzi Mandes, MD      . lactulose [facility]) enema 200 gm  300 mL Rectal TID PRN Melton Alar, PA-C      . levothyroxine (SYNTHROID, LEVOTHROID) tablet 200 mcg  200 mcg Oral Weber Cooks, MD   200 mcg at 02/13/17 0824  . multivitamin with minerals  tablet 1 tablet  1 tablet Oral Daily Fritzi Mandes, MD   1 tablet at 02/13/17 1201  . nystatin (MYCOSTATIN/NYSTOP) topical powder   Topical TID Melton Alar, PA-C      . ondansetron [facility]) tablet 4 mg  4 mg Oral Q6H PRN Fritzi Mandes, MD       Or  . ondansetron (ZOFRAN) injection 4 mg  4 mg Intravenous Q6H PRN Fritzi Mandes, MD   4 mg at 02/12/17 1240  . pantoprazole (PROTONIX) EC tablet 40 mg  40 mg Oral BID Fritzi Mandes, MD   40 mg at 02/13/17 1000  . spironolactone (ALDACTONE) tablet 50 mg  50 mg Oral Daily Fritzi Mandes, MD   50 mg at 02/13/17 1000  . vancomycin (VANCOCIN) 1,250 mg in sodium chloride 0.9 % 250 mL IVPB  1,250 mg Intravenous Q24H Fritzi Mandes, MD   1,250 mg at 02/13/17 0413     Discharge Medications: Please see  discharge summary for a list of discharge medications.  Relevant Imaging Results:  Relevant Lab Results:   Additional Information SSN [id number]  Ross Ludwig, [place]

## 2017-02-13 NOTE — Clinical Social Work Note (Signed)
Clinical Social Work Assessment  Patient Details  Name: Roberto Perez MRN: [id number] Date of Birth: 1951/01/21  Date of referral:  02/13/17               Reason for consult:  Facility Placement                Permission sought to share information with:  Family Supports, Customer service manager Permission granted to share information::  Yes, Verbal Permission Granted  Name::     Diyari, Cherne [phone]   Agency::  SNF admissions  Relationship::     Contact Information:     Housing/Transportation Living arrangements for the past 2 months:  [place] of Information:  Patient, Medical Team Patient Interpreter Needed:  None Criminal Activity/Legal Involvement Pertinent to Current Situation/Hospitalization:  No - Comment as needed Significant Relationships:  Other Family Members Lives with:  Facility Resident Do you feel safe going back to the place where you live?  Yes Need for family participation in patient care:  No (Coment)  Care giving concerns:  Patient did not express any issues about returning back to [facility] SNF.   Social Worker assessment / plan: Patient is a 66 year old male who was recently discharged from [facility] and went to [facility] SNF for short term rehab.  Patient is alert and oriented x2, and has some mild confusion still.  Patient states he has been in rehab, and he plans to return back to SNF.  Patient was informed of role of CSW and what the process is for discharging him back to SNF.  Patient expressed that he did not have any issues or concerns about returning back to SNF.  Patient gave CSW permission to send information to [facility] to review to continue his short term rehab.  Employment status:  Retired Nurse, adult PT Recommendations:  [place] / Referral to community resources:  [place]  Patient/Family's Response to care:  Patient in agreement to  returning back to SNF.  Patient/Family's Understanding of and Emotional Response to Diagnosis, Current Treatment, and Prognosis:  Patient aware of current treatment plan and he is hopeful he will not have to be in hospital much longer so he can continue wit his therapy.   Emotional Assessment Appearance:  Appears stated age Attitude/Demeanor/Rapport:    Affect (typically observed):  Appropriate, Calm Orientation:  Oriented to Self, Oriented to Situation Alcohol / Substance use:  Not Applicable Psych involvement (Current and /or in the community):  No (Comment)  Discharge Needs  Concerns to be addressed:  No discharge needs identified Readmission within the last 30 days:  Yes (Patient discharged from [facility] to [facility] SNF on 02-06-17) Current discharge risk:  None Barriers to Discharge:  Continued Medical Work up   Anell Barr 02/13/2017, 4:52 PM

## 2017-02-13 NOTE — Progress Notes (Addendum)
Daily Progress Note   Patient Name: Roberto Perez       Date: 02/13/2017 DOB: 1951/05/26  Age: 66 y.o. MRN#: [id number] Attending Physician: Fritzi Mandes, MD Primary Care Physician: Gayland Curry, MD Admit Date: 02/12/2017  Reason for Consultation/Follow-up: Establishing goals of care  Subjective: Patient much less confused today.He is lethargic.  He is asking for his teeth so that he can eat lunch.  I asked if he knew why he was in the hospital.  He responded that his blood count was low.  When I told him he had a blood stream infection he replied "if it ain't one thing its another".  He denies pain or discomfort.  I left UptoDate patient information at beside for Mr. Hirschmann's family regarding hepatic encephalopathy and cirrhosis.  I left a voice mail for his brother [place].   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  Here with encephalopathy, MRSA bacteremia, UTI, and acute on chronic renal failure.  Encephalopathy appears to be clearing today.  Patient is eating on his own.   Patient Profile/HPI: 66 y.o. male  with past medical history of NASH (with portal gastropathy), CKD 3 (baseline creatinine is 1.5 on 02/06/17) , DM, recurrent GIB who was admitted on 02/12/2017 with encephalopathy secondary to UTI and cirrhosis.  He also has acute on chronic kidney failure (creatinine 2.98). He is being treated with antibiotics and lactulose.  Paracentesis is being considered for relief and to evaluate for SBP.  He has had recurrent admissions at [facility] with anemia and encephalopathy. 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  He is supposed to remain on iron  supplementation.   Length of Stay: 1  Current Medications: Scheduled Meds:  . ammonium lactate   Topical BID  . carvedilol  3.125 mg Oral BID  . cefTRIAXone (ROCEPHIN)  IV  1 g Intravenous Q24H  . insulin aspart  0-9 Units Subcutaneous TID WC  . lactulose  30 g Oral TID  . levothyroxine  200 mcg Oral BH-q7a  . multivitamin with minerals  1 tablet Oral Daily  . nystatin   Topical TID  . pantoprazole  40 mg Oral BID  . spironolactone  50 mg Oral Daily  . vancomycin  1,250  mg Intravenous Q24H    Continuous Infusions: . sodium chloride 50 mL/hr at 02/13/17 1200    PRN Meds: haloperidol lactate, lactulose, ondansetron **OR** ondansetron (ZOFRAN) IV  Physical Exam       Well developed male, NAD, lethargic, pleasant, cooperative.  Vital Signs: BP (!) 102/50 (BP Location: Left Arm)   Pulse 63   Temp 97.7 F (36.5 C) (Oral)   Resp 20   Ht 6\' 2"  (1.88 m)   Wt 117.5 kg (259 lb 1.6 oz)   SpO2 98%   BMI 33.27 kg/m  SpO2: SpO2: 98 % O2 Device: O2 Device: Not Delivered O2 Flow Rate: O2 Flow Rate (L/min): 0 L/min  Intake/output summary:  Intake/Output Summary (Last 24 hours) at 02/13/17 1335 Last data filed at 02/13/17 1318  Gross per 24 hour  Intake            862.5 ml  Output              700 ml  Net            162.5 ml   LBM: Last BM Date: 02/12/17 Baseline Weight: Weight: 119.7 kg (264 lb) Most recent weight: Weight: 117.5 kg (259 lb 1.6 oz)       Palliative Assessment/Data:    Flowsheet Rows     Most Recent Value  Intake Tab  Referral Department  Hospitalist  Unit at Time of Referral  Med/Surg Unit  Palliative Care Primary Diagnosis  Other (Comment)  Date Notified  02/12/17  Palliative Care Type  New Palliative care  Reason for referral  Clarify Goals of Care  Date of Admission  02/12/17  Date first seen by Palliative Care  02/12/17  # of days Palliative referral response time  0 Day(s)  # of days IP prior to Palliative referral  0  Clinical Assessment   Palliative Performance Scale Score  30%  Psychosocial & Spiritual Assessment  Palliative Care Outcomes  Patient/Family meeting held?  Yes  Who was at the meeting?  patient and brother Ed  Palliative Care Outcomes  Changed CPR status  Patient/Family wishes: Interventions discontinued/not started   Mechanical Ventilation      Patient Active Problem List   Diagnosis Date Noted  . Encephalopathy 02/12/2017  . Ascites   . End stage liver disease ([place])   . Palliative care encounter   . DNR (do not resuscitate)     Palliative Care Plan    Recommendations/Plan:  Palliative will continue to try to educate the family about the patient's chronic comorbidities.  I do not believe they understand the severity of his co-morbidities.  If he declines we will discuss Hospice services with the family.  If he returns to SNF, Please have Palliative Care follow outpatient.  Goals of Care and Additional Recommendations:  Limitations on Scope of Treatment: Full Scope Treatment  Code Status:  DNR  Prognosis:   < 6 months Certainly at high risk of an acute event at any time.   Given ESLD, recurrent bleeding, chronic kidney disease, Wounds, PVD, and recurrent infections and encephalopathy  Discharge Planning:  [place] for rehab with Palliative care service follow-up  Care plan was discussed with attending MD and RN.  Thank you for allowing the Palliative Medicine Team to assist in the care of this patient.  Total time spent:  35 min.     Greater than 50%  of this time was spent counseling and coordinating care related to the above assessment and plan.  Imogene Burn, PA-C Palliative Medicine  Please contact Palliative MedicineTeam phone at [phone] for questions and concerns between 7 am - 7 pm.   Please see AMION for individual provider pager numbers.

## 2017-02-13 NOTE — Progress Notes (Signed)
ANTIBIOTIC CONSULT NOTE  Pharmacy Consult for Vancomycin Indication: bacteremia  No Known Allergies  Patient Measurements: Height: 6\' 2"  (188 cm) Weight: 259 lb 1.6 oz (117.5 kg) IBW/kg (Calculated) : 82.2 Adjusted Body Weight: 96.32 kg   Vital Signs: Temp: 97.5 F (36.4 C) (04/06 0832) Temp Source: Oral (04/06 0832) BP: 108/56 (04/06 1114) Pulse Rate: 74 (04/06 0832) Intake/Output from previous day: 04/05 0701 - 04/06 0700 In: 862.5 [I.V.:862.5] Out: 350 [Urine:350] Intake/Output from this shift: No intake/output data recorded.  Labs:  Recent Labs  02/12/17 0515 02/13/17 0520  WBC 3.8  --   HGB 7.7*  --   PLT 60*  --   CREATININE 2.98* 2.71*   Estimated Creatinine Clearance: 36.5 mL/min (A) (by C-G formula based on SCr of 2.71 mg/dL (H)). No results for input(s): VANCOTROUGH, VANCOPEAK, VANCORANDOM, GENTTROUGH, GENTPEAK, GENTRANDOM, TOBRATROUGH, TOBRAPEAK, TOBRARND, AMIKACINPEAK, AMIKACINTROU, AMIKACIN in the last 72 hours.   Microbiology: Recent Results (from the past 720 hour(s))  Blood culture (routine x 2)     Status: None (Preliminary result)   Collection Time: 02/12/17  5:17 AM  Result Value Ref Range Status   Specimen Description BLOOD R FA  Final   Special Requests   Final    BOTTLES DRAWN AEROBIC AND ANAEROBIC Blood Culture adequate volume   Culture  Setup Time   Final    Organism ID to follow GRAM POSITIVE COCCI IN BOTH AEROBIC AND ANAEROBIC BOTTLES CRITICAL RESULT CALLED TO, READ BACK BY AND VERIFIED WITH: JASON ROBBINS AT 2037 02/12/2017 BY TFK MATT MCBANE @ 6010 ON 02/13/2017 BY CAF    Culture GRAM POSITIVE COCCI  Final   Report Status PENDING  Incomplete  Blood culture (routine x 2)     Status: None (Preliminary result)   Collection Time: 02/12/17  5:17 AM  Result Value Ref Range Status   Specimen Description BLOOD R H  Final   Special Requests   Final    BOTTLES DRAWN AEROBIC AND ANAEROBIC Blood Culture adequate volume   Culture  Setup Time    Final    GRAM POSITIVE COCCI ANAEROBIC BOTTLE ONLY CRITICAL RESULT CALLED TO, READ BACK BY AND VERIFIED WITH: MATT MCBANE @ 9323 ON 02/13/2017 BY CAF    Culture GRAM POSITIVE COCCI  Final   Report Status PENDING  Incomplete  Urine culture     Status: Abnormal   Collection Time: 02/12/17  5:17 AM  Result Value Ref Range Status   Specimen Description URINE, RANDOM  Final   Special Requests NONE  Final   Culture MULTIPLE SPECIES PRESENT, SUGGEST RECOLLECTION (A)  Final   Report Status 02/13/2017 FINAL  Final  Blood Culture ID Panel (Reflexed)     Status: Abnormal   Collection Time: 02/12/17  5:17 AM  Result Value Ref Range Status   Enterococcus species NOT DETECTED NOT DETECTED Final   Listeria monocytogenes NOT DETECTED NOT DETECTED Final   Staphylococcus species DETECTED (A) NOT DETECTED Final    Comment: CRITICAL RESULT CALLED TO, READ BACK BY AND VERIFIED WITH: JASON ROBBINS AT 2037 02/12/2017 BY TFK    Staphylococcus aureus DETECTED (A) NOT DETECTED Final    Comment: Methicillin (oxacillin)-resistant Staphylococcus aureus (MRSA). MRSA is predictably resistant to beta-lactam antibiotics (except ceftaroline). Preferred therapy is vancomycin unless clinically contraindicated. Patient requires contact precautions if  hospitalized. CRITICAL RESULT CALLED TO, READ BACK BY AND VERIFIED WITH: JASON ROBBINS AT 2037 02/12/2017 BY TFK    Methicillin resistance DETECTED (A) NOT DETECTED Final  Comment: CRITICAL RESULT CALLED TO, READ BACK BY AND VERIFIED WITH: JASON ROBBINS AT 2037 02/12/2017 BY TFK    Streptococcus species DETECTED (A) NOT DETECTED Final    Comment: CRITICAL RESULT CALLED TO, READ BACK BY AND VERIFIED WITH: JASON ROBBINS AT 2037 02/12/2017 BY TFK    Streptococcus agalactiae DETECTED (A) NOT DETECTED Final    Comment: CRITICAL RESULT CALLED TO, READ BACK BY AND VERIFIED WITH: JASON ROBBINS AT 2037 02/12/2017 BY TFK    Streptococcus pneumoniae NOT DETECTED NOT  DETECTED Final   Streptococcus pyogenes NOT DETECTED NOT DETECTED Final   Acinetobacter baumannii NOT DETECTED NOT DETECTED Final   Enterobacteriaceae species NOT DETECTED NOT DETECTED Final   Enterobacter cloacae complex NOT DETECTED NOT DETECTED Final   Escherichia coli NOT DETECTED NOT DETECTED Final   Klebsiella oxytoca NOT DETECTED NOT DETECTED Final   Klebsiella pneumoniae NOT DETECTED NOT DETECTED Final   Proteus species NOT DETECTED NOT DETECTED Final   Serratia marcescens NOT DETECTED NOT DETECTED Final   Haemophilus influenzae NOT DETECTED NOT DETECTED Final   Neisseria meningitidis NOT DETECTED NOT DETECTED Final   Pseudomonas aeruginosa NOT DETECTED NOT DETECTED Final   Candida albicans NOT DETECTED NOT DETECTED Final   Candida glabrata NOT DETECTED NOT DETECTED Final   Candida krusei NOT DETECTED NOT DETECTED Final   Candida parapsilosis NOT DETECTED NOT DETECTED Final   Candida tropicalis NOT DETECTED NOT DETECTED Final  MRSA PCR Screening     Status: None   Collection Time: 02/12/17  3:03 PM  Result Value Ref Range Status   MRSA by PCR NEGATIVE NEGATIVE Final    Comment:        The GeneXpert MRSA Assay (FDA approved for NASAL specimens only), is one component of a comprehensive MRSA colonization surveillance program. It is not intended to diagnose MRSA infection nor to guide or monitor treatment for MRSA infections.     Medical History: Past Medical History:  Diagnosis Date  . Anemia     Medications:  Prescriptions Prior to Admission  Medication Sig Dispense Refill Last Dose  . allopurinol (ZYLOPRIM) 300 MG tablet Take 300 mg by mouth daily.   02/11/2017 at 0800  . carvedilol (COREG) 3.125 MG tablet Take 3.125 mg by mouth 2 (two) times daily.   02/11/2017 at 2000  . glipiZIDE (GLUCOTROL XL) 10 MG 24 hr tablet Take 10 mg by mouth daily.   02/11/2017 at 0800  . lactulose (CHRONULAC) 10 GM/15ML solution Take 15 mLs by mouth 3 (three) times daily.   02/11/2017 at  2000  . LEVEMIR 100 UNIT/ML injection Inject 20 Units into the skin daily.   02/11/2017 at 0800  . levothyroxine (SYNTHROID, LEVOTHROID) 200 MCG tablet Take 200 mcg by mouth daily.   02/12/2017 at 0600  . pantoprazole (PROTONIX) 40 MG tablet Take 40 mg by mouth 2 (two) times daily.   02/11/2017 at 2000  . spironolactone (ALDACTONE) 100 MG tablet Take 50 mg by mouth daily.   02/11/2017 at 0800   Assessment: Pharmacy consulted to dose vancomycin in this 66 year old male with blood Cx growing Strep agalactiae and Staph A (Mech A +) per BCID (GPC in 2/2). Patient is also on ceftriaxone for UTI and possible SBP.   Goal of Therapy:  Vancomycin trough level 15-20 mcg/ml  Plan:  Expected duration 7 days with resolution of temperature and/or normalization of WBC  Will continue vancomycin 1250 mg iv q 24 hours with planned trough 4/9. SCr ordered 4/7. Will  continue to follow and adjust dosing/order levels as clinically indicated.   Ulice Dash D 02/13/2017,11:17 AM

## 2017-02-13 NOTE — Consult Note (Signed)
[facility] Infectious Disease     Reason for Consult:Bacteremia Referring Physician: Nicholes Mango  Date of Admission:  02/12/2017   Active Problems:   Encephalopathy   Ascites   End stage liver disease ([place])   Palliative care encounter   DNR (do not resuscitate)   HPI: Roberto Perez is a 66 y.o. male with NASH cirrhosis, ascites, anemia s/p admission at [facility] recently now admitted with AMS, worsening renal failure from NH. On admit wbc 3.8, was hypothermic. Cr was 2.8, ammonia 163, ua tntc wbc. He underwent paracentesis as we with 2967 wbc, 71% PMNs. BCX + Staph and GBS. He is a poor historian but denies abd pain, cough.    Past Medical History:  Diagnosis Date  . Anemia    History reviewed. No pertinent surgical history. Social History  Substance Use Topics  . Smoking status: Former Research scientist (life sciences)  . Smokeless tobacco: Never Used  . Alcohol use No   History reviewed. No pertinent family history.  Allergies: No Known Allergies  Current antibiotics: Antibiotics Given (last 72 hours)    Date/Time Action Medication Dose Rate   02/12/17 0740 Given   cefTRIAXone (ROCEPHIN) IVPB 1 g 1 g 100 mL/hr   02/12/17 2256 Given   vancomycin (VANCOCIN) 1,250 mg in sodium chloride 0.9 % 250 mL IVPB 1,250 mg 166.7 mL/hr   02/13/17 0413 Given   vancomycin (VANCOCIN) 1,250 mg in sodium chloride 0.9 % 250 mL IVPB 1,250 mg 166.7 mL/hr   02/13/17 0824 Given   cefTRIAXone (ROCEPHIN) 1 g in dextrose 5 % 50 mL IVPB 1 g 100 mL/hr      MEDICATIONS: . ammonium lactate   Topical BID  . carvedilol  3.125 mg Oral BID  . cefTRIAXone (ROCEPHIN)  IV  1 g Intravenous Q24H  . insulin aspart  0-9 Units Subcutaneous TID WC  . lactulose  10 g Oral TID  . levothyroxine  200 mcg Oral BH-q7a  . multivitamin with minerals  1 tablet Oral Daily  . nystatin   Topical TID  . pantoprazole  40 mg Oral BID  . spironolactone  50 mg Oral Daily  . vancomycin  1,250 mg Intravenous Q24H   Review of Systems - 11 systems  reviewed and negative per HPI  OBJECTIVE: Temp:  [97.5 F (36.4 C)-97.8 F (36.6 C)] 97.7 F (36.5 C) (04/06 1318) Pulse Rate:  [63-82] 63 (04/06 1318) Resp:  [16-22] 20 (04/06 1318) BP: (102-122)/(50-66) 102/50 (04/06 1318) SpO2:  [95 %-100 %] 98 % (04/06 1318) Physical Exam  Constitutional: He is disoriented appearing, dishelved HENT: anicteric Mouth/Throat: Oropharynx is clear and moist. Very poor dentition No oropharyngeal exudate.  Cardiovascular: Normal rate, regular rhythm and normal heart sounds.  Pulmonary/Chest: Effort normal and breath sounds normal. No respiratory distress. He has no wheezes.  Abdominal: Soft. Obese, + ascites Bowel sounds are normal. He exhibits no distension. There is no tenderness.  Lymphadenopathy: He has no cervical adenopathy.  Neurological: He is alert and oriented to person, place, and time. + asterixs Slowed mentation  Ext LLE with 2+ edema Skin: LLE with chronic lymphededa and changes of dermatitis. Apprx 3x 2 cm ulcer with purulence ant shin Psychiatric: slowed mentation     LABS: Results for orders placed or performed during the hospital encounter of 02/12/17 (from the past 48 hour(s))  CBC     Status: Abnormal   Collection Time: 02/12/17  5:15 AM  Result Value Ref Range   WBC 3.8 3.8 - 10.6 K/uL  RBC 2.49 (L) 4.40 - 5.90 MIL/uL   Hemoglobin 7.7 (L) 13.0 - 18.0 g/dL   HCT 24.0 (L) 40.0 - 52.0 %   MCV 96.2 80.0 - 100.0 fL   MCH 31.1 26.0 - 34.0 pg   MCHC 32.3 32.0 - 36.0 g/dL   RDW 24.4 (H) 11.5 - 14.5 %   Platelets 60 (L) 150 - 440 K/uL  Comprehensive metabolic panel     Status: Abnormal   Collection Time: 02/12/17  5:15 AM  Result Value Ref Range   Sodium 137 135 - 145 mmol/L   Potassium 4.0 3.5 - 5.1 mmol/L   Chloride 103 101 - 111 mmol/L   CO2 27 22 - 32 mmol/L   Glucose, Bld 157 (H) 65 - 99 mg/dL   BUN 78 (H) 6 - 20 mg/dL   Creatinine, Ser 2.98 (H) 0.61 - 1.24 mg/dL   Calcium 8.7 (L) 8.9 - 10.3 mg/dL   Total Protein  5.9 (L) 6.5 - 8.1 g/dL   Albumin 2.6 (L) 3.5 - 5.0 g/dL   AST 18 15 - 41 U/L   ALT 13 (L) 17 - 63 U/L   Alkaline Phosphatase 116 38 - 126 U/L   Total Bilirubin 1.3 (H) 0.3 - 1.2 mg/dL   GFR calc non Af Amer 20 (L) >60 mL/min   GFR calc Af Amer 24 (L) >60 mL/min    Comment: (NOTE) The eGFR has been calculated using the CKD EPI equation. This calculation has not been validated in all clinical situations. eGFR's persistently <60 mL/min signify possible Chronic Kidney Disease.    Anion gap 7 5 - 15  Ammonia     Status: Abnormal   Collection Time: 02/12/17  5:15 AM  Result Value Ref Range   Ammonia 163 (H) 9 - 35 umol/L  Protime-INR     Status: Abnormal   Collection Time: 02/12/17  5:15 AM  Result Value Ref Range   Prothrombin Time 19.5 (H) 11.4 - 15.2 seconds   INR 1.63   Blood culture (routine x 2)     Status: None (Preliminary result)   Collection Time: 02/12/17  5:17 AM  Result Value Ref Range   Specimen Description BLOOD R FA    Special Requests      BOTTLES DRAWN AEROBIC AND ANAEROBIC Blood Culture adequate volume   Culture  Setup Time      GRAM POSITIVE COCCI IN BOTH AEROBIC AND ANAEROBIC BOTTLES CRITICAL RESULT CALLED TO, READ BACK BY AND VERIFIED WITH: JASON ROBBINS AT 2037 02/12/2017 BY TFK MATT MCBANE @ 6063 ON 02/13/2017 BY CAF Performed at [facility], [place] [address] [place], [place] [address]    Culture GRAM POSITIVE COCCI    Report Status PENDING   Blood culture (routine x 2)     Status: None (Preliminary result)   Collection Time: 02/12/17  5:17 AM  Result Value Ref Range   Specimen Description BLOOD R H    Special Requests      BOTTLES DRAWN AEROBIC AND ANAEROBIC Blood Culture adequate volume   Culture  Setup Time      GRAM POSITIVE COCCI ANAEROBIC BOTTLE ONLY CRITICAL RESULT CALLED TO, READ BACK BY AND VERIFIED WITH: MATT MCBANE @ 0932 ON 02/13/2017 BY CAF    Culture GRAM POSITIVE COCCI    Report Status PENDING   Urinalysis, Complete w Microscopic      Status: Abnormal   Collection Time: 02/12/17  5:17 AM  Result Value Ref Range  Color, Urine YELLOW (A) YELLOW   APPearance TURBID (A) CLEAR   Specific Gravity, Urine 1.012 1.005 - 1.030   pH 5.0 5.0 - 8.0   Glucose, UA NEGATIVE NEGATIVE mg/dL   Hgb urine dipstick SMALL (A) NEGATIVE   Bilirubin Urine NEGATIVE NEGATIVE   Ketones, ur NEGATIVE NEGATIVE mg/dL   Protein, ur 30 (A) NEGATIVE mg/dL   Nitrite NEGATIVE NEGATIVE   Leukocytes, UA MODERATE (A) NEGATIVE   RBC / HPF 0-5 0 - 5 RBC/hpf   WBC, UA TOO NUMEROUS TO COUNT 0 - 5 WBC/hpf   Bacteria, UA MANY (A) NONE SEEN   Squamous Epithelial / LPF NONE SEEN NONE SEEN   WBC Clumps PRESENT    Crystals PRESENT (A) NEGATIVE  Urine culture     Status: Abnormal   Collection Time: 02/12/17  5:17 AM  Result Value Ref Range   Specimen Description URINE, RANDOM    Special Requests NONE    Culture MULTIPLE SPECIES PRESENT, SUGGEST RECOLLECTION (A)    Report Status 02/13/2017 FINAL   Blood Culture ID Panel (Reflexed)     Status: Abnormal   Collection Time: 02/12/17  5:17 AM  Result Value Ref Range   Enterococcus species NOT DETECTED NOT DETECTED   Listeria monocytogenes NOT DETECTED NOT DETECTED   Staphylococcus species DETECTED (A) NOT DETECTED    Comment: CRITICAL RESULT CALLED TO, READ BACK BY AND VERIFIED WITH: JASON ROBBINS AT 2037 02/12/2017 BY TFK    Staphylococcus aureus DETECTED (A) NOT DETECTED    Comment: Methicillin (oxacillin)-resistant Staphylococcus aureus (MRSA). MRSA is predictably resistant to beta-lactam antibiotics (except ceftaroline). Preferred therapy is vancomycin unless clinically contraindicated. Patient requires contact precautions if  hospitalized. CRITICAL RESULT CALLED TO, READ BACK BY AND VERIFIED WITH: JASON ROBBINS AT 2037 02/12/2017 BY TFK    Methicillin resistance DETECTED (A) NOT DETECTED    Comment: CRITICAL RESULT CALLED TO, READ BACK BY AND VERIFIED WITH: JASON ROBBINS AT 2037 02/12/2017 BY TFK     Streptococcus species DETECTED (A) NOT DETECTED    Comment: CRITICAL RESULT CALLED TO, READ BACK BY AND VERIFIED WITH: JASON ROBBINS AT 2037 02/12/2017 BY TFK    Streptococcus agalactiae DETECTED (A) NOT DETECTED    Comment: CRITICAL RESULT CALLED TO, READ BACK BY AND VERIFIED WITH: JASON ROBBINS AT 2037 02/12/2017 BY TFK    Streptococcus pneumoniae NOT DETECTED NOT DETECTED   Streptococcus pyogenes NOT DETECTED NOT DETECTED   Acinetobacter baumannii NOT DETECTED NOT DETECTED   Enterobacteriaceae species NOT DETECTED NOT DETECTED   Enterobacter cloacae complex NOT DETECTED NOT DETECTED   Escherichia coli NOT DETECTED NOT DETECTED   Klebsiella oxytoca NOT DETECTED NOT DETECTED   Klebsiella pneumoniae NOT DETECTED NOT DETECTED   Proteus species NOT DETECTED NOT DETECTED   Serratia marcescens NOT DETECTED NOT DETECTED   Haemophilus influenzae NOT DETECTED NOT DETECTED   Neisseria meningitidis NOT DETECTED NOT DETECTED   Pseudomonas aeruginosa NOT DETECTED NOT DETECTED   Candida albicans NOT DETECTED NOT DETECTED   Candida glabrata NOT DETECTED NOT DETECTED   Candida krusei NOT DETECTED NOT DETECTED   Candida parapsilosis NOT DETECTED NOT DETECTED   Candida tropicalis NOT DETECTED NOT DETECTED  Lactic acid, plasma     Status: None   Collection Time: 02/12/17  5:18 AM  Result Value Ref Range   Lactic Acid, Venous 1.2 0.5 - 1.9 mmol/L  Glucose, capillary     Status: Abnormal   Collection Time: 02/12/17 10:19 AM  Result Value Ref Range  Glucose-Capillary 162 (H) 65 - 99 mg/dL  Glucose, capillary     Status: Abnormal   Collection Time: 02/12/17  1:00 PM  Result Value Ref Range   Glucose-Capillary 122 (H) 65 - 99 mg/dL  MRSA PCR Screening     Status: None   Collection Time: 02/12/17  3:03 PM  Result Value Ref Range   MRSA by PCR NEGATIVE NEGATIVE    Comment:        The GeneXpert MRSA Assay (FDA approved for NASAL specimens only), is one component of a comprehensive MRSA  colonization surveillance program. It is not intended to diagnose MRSA infection nor to guide or monitor treatment for MRSA infections.   Glucose, capillary     Status: Abnormal   Collection Time: 02/12/17  4:56 PM  Result Value Ref Range   Glucose-Capillary 133 (H) 65 - 99 mg/dL  Glucose, capillary     Status: Abnormal   Collection Time: 02/12/17  5:43 PM  Result Value Ref Range   Glucose-Capillary 137 (H) 65 - 99 mg/dL  Glucose, capillary     Status: Abnormal   Collection Time: 02/12/17  9:08 PM  Result Value Ref Range   Glucose-Capillary 155 (H) 65 - 99 mg/dL  Basic metabolic panel     Status: Abnormal   Collection Time: 02/13/17  5:20 AM  Result Value Ref Range   Sodium 144 135 - 145 mmol/L   Potassium 3.8 3.5 - 5.1 mmol/L   Chloride 111 101 - 111 mmol/L   CO2 25 22 - 32 mmol/L   Glucose, Bld 116 (H) 65 - 99 mg/dL   BUN 73 (H) 6 - 20 mg/dL   Creatinine, Ser 4.85 (H) 0.61 - 1.24 mg/dL   Calcium 8.7 (L) 8.9 - 10.3 mg/dL   GFR calc non Af Amer 23 (L) >60 mL/min   GFR calc Af Amer 27 (L) >60 mL/min    Comment: (NOTE) The eGFR has been calculated using the CKD EPI equation. This calculation has not been validated in all clinical situations. eGFR's persistently <60 mL/min signify possible Chronic Kidney Disease.    Anion gap 8 5 - 15  Ammonia     Status: Abnormal   Collection Time: 02/13/17  5:20 AM  Result Value Ref Range   Ammonia 68 (H) 9 - 35 umol/L  Glucose, capillary     Status: Abnormal   Collection Time: 02/13/17  7:36 AM  Result Value Ref Range   Glucose-Capillary 119 (H) 65 - 99 mg/dL  Body fluid cell count with differential     Status: Abnormal   Collection Time: 02/13/17 10:45 AM  Result Value Ref Range   Fluid Type-FCT PERITONEAL     Comment: CORRECTED ON 04/06 AT 1150: PREVIOUSLY REPORTED AS CYTOPERI   Color, Fluid YELLOW YELLOW   Appearance, Fluid CLOUDY (A) CLEAR   WBC, Fluid 2,967 cu mm   Neutrophil Count, Fluid 71 %   Lymphs, Fluid 8 %    Monocyte-Macrophage-Serous Fluid 21 %  Albumin, pleural or peritoneal fluid     Status: None   Collection Time: 02/13/17 10:45 AM  Result Value Ref Range   Albumin, Fluid <1.0 g/dL    Comment: (NOTE) No normal range established for this test Results should be evaluated in conjunction with serum values    Fluid Type-FALB PERITONEAL   Glucose, capillary     Status: Abnormal   Collection Time: 02/13/17 11:46 AM  Result Value Ref Range   Glucose-Capillary 150 (H) 65 - 99  mg/dL  Phosphorus     Status: None   Collection Time: 02/13/17  1:02 PM  Result Value Ref Range   Phosphorus 4.3 2.5 - 4.6 mg/dL   No components found for: ESR, C REACTIVE PROTEIN MICRO: Recent Results (from the past 720 hour(s))  Blood culture (routine x 2)     Status: None (Preliminary result)   Collection Time: 02/12/17  5:17 AM  Result Value Ref Range Status   Specimen Description BLOOD R FA  Final   Special Requests   Final    BOTTLES DRAWN AEROBIC AND ANAEROBIC Blood Culture adequate volume   Culture  Setup Time   Final    GRAM POSITIVE COCCI IN BOTH AEROBIC AND ANAEROBIC BOTTLES CRITICAL RESULT CALLED TO, READ BACK BY AND VERIFIED WITH: JASON ROBBINS AT 2037 02/12/2017 BY TFK MATT MCBANE @ 9211 ON 02/13/2017 BY CAF Performed at [facility], [place] [address], [place], [place] [address]    Culture GRAM POSITIVE COCCI  Final   Report Status PENDING  Incomplete  Blood culture (routine x 2)     Status: None (Preliminary result)   Collection Time: 02/12/17  5:17 AM  Result Value Ref Range Status   Specimen Description BLOOD R H  Final   Special Requests   Final    BOTTLES DRAWN AEROBIC AND ANAEROBIC Blood Culture adequate volume   Culture  Setup Time   Final    GRAM POSITIVE COCCI ANAEROBIC BOTTLE ONLY CRITICAL RESULT CALLED TO, READ BACK BY AND VERIFIED WITH: MATT MCBANE @ 0814 ON 02/13/2017 BY CAF    Culture GRAM POSITIVE COCCI  Final   Report Status PENDING  Incomplete  Urine culture     Status:  Abnormal   Collection Time: 02/12/17  5:17 AM  Result Value Ref Range Status   Specimen Description URINE, RANDOM  Final   Special Requests NONE  Final   Culture MULTIPLE SPECIES PRESENT, SUGGEST RECOLLECTION (A)  Final   Report Status 02/13/2017 FINAL  Final  Blood Culture ID Panel (Reflexed)     Status: Abnormal   Collection Time: 02/12/17  5:17 AM  Result Value Ref Range Status   Enterococcus species NOT DETECTED NOT DETECTED Final   Listeria monocytogenes NOT DETECTED NOT DETECTED Final   Staphylococcus species DETECTED (A) NOT DETECTED Final    Comment: CRITICAL RESULT CALLED TO, READ BACK BY AND VERIFIED WITH: JASON ROBBINS AT 2037 02/12/2017 BY TFK    Staphylococcus aureus DETECTED (A) NOT DETECTED Final    Comment: Methicillin (oxacillin)-resistant Staphylococcus aureus (MRSA). MRSA is predictably resistant to beta-lactam antibiotics (except ceftaroline). Preferred therapy is vancomycin unless clinically contraindicated. Patient requires contact precautions if  hospitalized. CRITICAL RESULT CALLED TO, READ BACK BY AND VERIFIED WITH: JASON ROBBINS AT 2037 02/12/2017 BY TFK    Methicillin resistance DETECTED (A) NOT DETECTED Final    Comment: CRITICAL RESULT CALLED TO, READ BACK BY AND VERIFIED WITH: JASON ROBBINS AT 2037 02/12/2017 BY TFK    Streptococcus species DETECTED (A) NOT DETECTED Final    Comment: CRITICAL RESULT CALLED TO, READ BACK BY AND VERIFIED WITH: JASON ROBBINS AT 2037 02/12/2017 BY TFK    Streptococcus agalactiae DETECTED (A) NOT DETECTED Final    Comment: CRITICAL RESULT CALLED TO, READ BACK BY AND VERIFIED WITH: JASON ROBBINS AT 2037 02/12/2017 BY TFK    Streptococcus pneumoniae NOT DETECTED NOT DETECTED Final   Streptococcus pyogenes NOT DETECTED NOT DETECTED Final   Acinetobacter baumannii NOT DETECTED NOT DETECTED Final  Enterobacteriaceae species NOT DETECTED NOT DETECTED Final   Enterobacter cloacae complex NOT DETECTED NOT DETECTED Final    Escherichia coli NOT DETECTED NOT DETECTED Final   Klebsiella oxytoca NOT DETECTED NOT DETECTED Final   Klebsiella pneumoniae NOT DETECTED NOT DETECTED Final   Proteus species NOT DETECTED NOT DETECTED Final   Serratia marcescens NOT DETECTED NOT DETECTED Final   Haemophilus influenzae NOT DETECTED NOT DETECTED Final   Neisseria meningitidis NOT DETECTED NOT DETECTED Final   Pseudomonas aeruginosa NOT DETECTED NOT DETECTED Final   Candida albicans NOT DETECTED NOT DETECTED Final   Candida glabrata NOT DETECTED NOT DETECTED Final   Candida krusei NOT DETECTED NOT DETECTED Final   Candida parapsilosis NOT DETECTED NOT DETECTED Final   Candida tropicalis NOT DETECTED NOT DETECTED Final  MRSA PCR Screening     Status: None   Collection Time: 02/12/17  3:03 PM  Result Value Ref Range Status   MRSA by PCR NEGATIVE NEGATIVE Final    Comment:        The GeneXpert MRSA Assay (FDA approved for NASAL specimens only), is one component of a comprehensive MRSA colonization surveillance program. It is not intended to diagnose MRSA infection nor to guide or monitor treatment for MRSA infections.     IMAGING: US Paracentesis  Result Date: 02/13/2017 INDICATION: Ascites. EXAM: ULTRASOUND GUIDED therapeutic and diagnostic PARACENTESIS MEDICATIONS: None. COMPLICATIONS: None immediate. PROCEDURE: Informed written consent was obtained from the patient after a discussion of the risks, benefits and alternatives to treatment. A timeout was performed prior to the initiation of the procedure. Initial ultrasound scanning demonstrates a large amount of ascites within the right lower abdominal quadrant. The right lower abdomen was prepped and draped in the usual sterile fashion. 1% lidocaine with epinephrine was used for local anesthesia. Following this, a Safe-T-Centesis catheter was introduced. An ultrasound image was saved for documentation purposes. The paracentesis was performed. The catheter was removed and  a dressing was applied. The patient tolerated the procedure well without immediate post procedural complication. FINDINGS: A total of approximately 3 L of serous fluid was removed. Samples were sent to the laboratory as requested by the clinical team. IMPRESSION: Successful ultrasound-guided paracentesis yielding 3 liters of peritoneal fluid. Electronically Signed   By: Marijo Conception, M.D.   On: 02/13/2017 11:49    Assessment:   Roberto Perez is a 66 y.o. 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 He has BCX + Staph and GBS. Has evidence of SBP on Paracentesis. Also + UA, UCX mixed.  He has RLE edema and a wound with some purulence.   Recommendations Cont current  abx - vanco and ceftriaxone -  pending culture results  Repeat Bcx Check echo  Cultured wound on RLE  Will need min 2 weeks IV abx for staph bacteremia - may need longer based on echo. Would place PICC line once fu bcx is negative x 48 hours Thank you very much for allowing me to participate in the care of this patient. Please call with questions.   Cheral Marker. Ola Spurr, MD

## 2017-02-13 NOTE — Clinical Social Work Note (Signed)
CSW attempted to meet with patient to complete assessment but patient was in a procedure.  CSW will try to complete assessment at a later time.  Jones Broom. Soham, MSW, [place]  02/13/2017 3:30 PM

## 2017-02-13 NOTE — Progress Notes (Signed)
[place] [facility] [place] NAME: Roberto Perez    MR#:  [id number]  DATE OF BIRTH:  [birth year]  SUBJECTIVE:  More alert today. Eating lunch. Some pleasant confusion  REVIEW OF SYSTEMS:   Review of Systems  Constitutional: Negative for chills, fever and weight loss.  HENT: Negative for ear discharge, ear pain and nosebleeds.   Eyes: Negative for blurred vision, pain and discharge.  Respiratory: Negative for sputum production, shortness of breath, wheezing and stridor.   Cardiovascular: Negative for chest pain, palpitations, orthopnea and PND.  Gastrointestinal: Negative for abdominal pain, diarrhea, nausea and vomiting.  Genitourinary: Negative for frequency and urgency.  Musculoskeletal: Negative for back pain and joint pain.  Neurological: Positive for weakness. Negative for sensory change, speech change and focal weakness.  Psychiatric/Behavioral: Negative for depression and hallucinations. The patient is not nervous/anxious.    Tolerating Diet:yes Tolerating PT: pending  DRUG ALLERGIES:  No Known Allergies  VITALS:  Blood pressure (!) 102/50, pulse 63, temperature 97.7 F (36.5 C), temperature source Oral, resp. rate 20, height 6\' 2"  (1.88 m), weight 117.5 kg (259 lb 1.6 oz), SpO2 98 %.  PHYSICAL EXAMINATION:   Physical Exam  GENERAL:  66 y.o.-year-old patient lying in the bed with no acute distress. Chronically ill EYES: Pupils equal, round, reactive to light and accommodation. + scleral icterus. Extraocular muscles intact. Pale skin HEENT: Head atraumatic, normocephalic. Oropharynx and nasopharynx clear.  NECK:  Supple, no jugular venous distention. No thyroid enlargement, no tenderness.  LUNGS: Normal breath sounds bilaterally, no wheezing, rales, rhonchi. No use of accessory muscles of respiration.  CARDIOVASCULAR: S1, S2 normal. No murmurs, rubs, or gallops.  ABDOMEN: Soft, nontender, +distended. Bowel sounds present. No  organomegaly or mass. Scrotal edema ++ EXTREMITIES: chronic bilateral LE venous stasis changes with venous ulcer on tibial shin, bilateral chronic edema NEUROLOGIC: Cranial nerves II through XII are intact. No focal Motor or sensory deficits b/l.   PSYCHIATRIC:  patient is alert and oriented x 3.  SKIN: No obvious rash, lesion Chronic tibial shin ulcer  LABORATORY PANEL:  CBC  Recent Labs Lab 02/12/17 0515  WBC 3.8  HGB 7.7*  HCT 24.0*  PLT 60*    Chemistries   Recent Labs Lab 02/12/17 0515 02/13/17 0520  NA 137 144  K 4.0 3.8  CL 103 111  CO2 27 25  GLUCOSE 157* 116*  BUN 78* 73*  CREATININE 2.98* 2.71*  CALCIUM 8.7* 8.7*  AST 18  --   ALT 13*  --   ALKPHOS 116  --   BILITOT 1.3*  --    Cardiac Enzymes No results for input(s): TROPONINI in the last 168 hours. RADIOLOGY:  US Paracentesis  Result Date: 02/13/2017 INDICATION: Ascites. EXAM: ULTRASOUND GUIDED therapeutic and diagnostic PARACENTESIS MEDICATIONS: None. COMPLICATIONS: None immediate. PROCEDURE: Informed written consent was obtained from the patient after a discussion of the risks, benefits and alternatives to treatment. A timeout was performed prior to the initiation of the procedure. Initial ultrasound scanning demonstrates a large amount of ascites within the right lower abdominal quadrant. The right lower abdomen was prepped and draped in the usual sterile fashion. 1% lidocaine with epinephrine was used for local anesthesia. Following this, a Safe-T-Centesis catheter was introduced. An ultrasound image was saved for documentation purposes. The paracentesis was performed. The catheter was removed and a dressing was applied. The patient tolerated the procedure well without immediate post procedural complication. FINDINGS: A total of approximately 3 L of  serous fluid was removed. Samples were sent to the laboratory as requested by the clinical team. IMPRESSION: Successful ultrasound-guided paracentesis yielding 3  liters of peritoneal fluid. Electronically Signed   By: Marijo Conception, M.D.   On: 02/13/2017 11:49   ASSESSMENT AND PLAN:  Roberto Perez  is a 66 y.o. male with a known history of Severe iron deficiency anemia which is chronic has had several blood transfusions in the recent past most recent admission at [facility] from March 28 to April 3 for severe anemia where he ended up getting 7 units of blood transfusion. Patient has history of CK D stage III creatinine around 1.9, type 2 diabetes, nonalcoholic fatty liver disease/cirrhosis with portal hypertension and ascites and pancytopenia comes to the emergency room from [facility] with altered mental status.  1. Altered mental status/acute encephalopathy appears metabolic/hepatic/Sepsis -Ammonia of 163---lactulose---63 -improving mentation -Patient has history of nonalcoholic fatty liver disease/cirrhosis of liver with complicated ascites, coagulopathy, portal hypertension, esophageal varices, pancytopenia -Lactulose 3 times a day -s/p Paracentesis ultrasound-guided therapeutic---fluid positive for 2900 WBC c/w SBP (already on IV Rocephin) -IV albumin -GI consultation appreciated  2. Severe iron deficiency anemia with significant chronic blood loss anemia -Patient's baseline hemoglobin is 6-7 -Hemoglobin today 7.7 -He recently had 7 units of blood transfusion last week at [facility] -Extensive workup has been done at [facility] in the form of EGD and colonoscopy which showed no variances however has history of varices status post banding in the past -Capsule endoscopy showed multiple mucosal defects throughout the entire bowel -Patient has been placed on iron pills and has been getting IV iron as well -We'll continue Protonix daily. -Continue oral iron  3. Ascites/anasarca/non alcoholic fatty liver disease/cirrhosis patient has been followed up at liver clinic at [facility] resume his Lasix and spironolactone once creatinine improves  4. Acute on chronic  renal failure -Multi-factorial appears hepatorenal syndrome with poor intravascular volume along with history of diabetes -We'll give IV fluids at low rate -Nephrology consultation noted.  -Baseline creatinine 1.7-1.9  5. MRSA/Strep Sepsis with UTI -IV Rocephin and IV vancomycin Follow-up urine cultures shows multiple species -ID consulted-spoke with Dr fitzgerald  6. DVT prophylaxis SCD teds Patient has chronic thrombocytopenia. Platelet Count 60,000 we'll avoid and a platelet agents.  7. Type 2 diabetes -Sliding-scale insulin  8. Patient carries a very poor prognosis given his multitude of medical problems . Palliative care consultation.pt is now DNR  Case discussed with Care Management/Social Worker. Management plans discussed with the patient, family and they are in agreement.  CODE STATUS: DNR  DVT Prophylaxis: SCD  TOTAL TIME TAKING CARE OF THIS PATIENT: 30 minutes.  >50% time spent on counselling and coordination of care  POSSIBLE D/C IN few DAYS, DEPENDING ON CLINICAL CONDITION.  Note: This dictation was prepared with Dragon dictation along with smaller phrase technology. Any transcriptional errors that result from this process are unintentional.  Rosamond Andress M.D on 02/13/2017 at 3:09 PM  Between 7am to 6pm - Pager - [phone]  After 6pm go to [email or web address] - password [email or web address] [facility]  Office  [phone]  CC: Primary care physician; Gayland Curry, MD

## 2017-02-14 LAB — GLUCOSE, CAPILLARY
Glucose-Capillary: 213 mg/dL — ABNORMAL HIGH (ref 65–99)
Glucose-Capillary: 289 mg/dL — ABNORMAL HIGH (ref 65–99)
Glucose-Capillary: 327 mg/dL — ABNORMAL HIGH (ref 65–99)
Glucose-Capillary: 320 mg/dL — ABNORMAL HIGH (ref 65–99)

## 2017-02-14 LAB — CBC
HEMATOCRIT: 25.8 % — AB (ref 40.0–52.0)
HEMOGLOBIN: 8.3 g/dL — AB (ref 13.0–18.0)
MCH: 32 pg (ref 26.0–34.0)
MCHC: 32 g/dL (ref 32.0–36.0)
MCV: 99.9 fL (ref 80.0–100.0)
Platelets: 69 10*3/uL — ABNORMAL LOW (ref 150–440)
RBC: 2.59 MIL/uL — AB (ref 4.40–5.90)
RDW: 24.8 % — AB (ref 11.5–14.5)
WBC: 3.3 10*3/uL — AB (ref 3.8–10.6)

## 2017-02-14 LAB — CREATININE, SERUM
Creatinine, Ser: 2.06 mg/dL — ABNORMAL HIGH (ref 0.61–1.24)
GFR calc Af Amer: 37 mL/min — ABNORMAL LOW (ref >60)
GFR calc non Af Amer: 32 mL/min — ABNORMAL LOW (ref >60)

## 2017-02-14 LAB — BASIC METABOLIC PANEL
Anion gap: 6 (ref 5–15)
BUN: 63 mg/dL — ABNORMAL HIGH (ref 6–20)
CHLORIDE: 110 mmol/L (ref 101–111)
CO2: 26 mmol/L (ref 22–32)
CREATININE: 2.01 mg/dL — AB (ref 0.61–1.24)
Calcium: 8.5 mg/dL — ABNORMAL LOW (ref 8.9–10.3)
GFR calc non Af Amer: 33 mL/min — ABNORMAL LOW (ref >60)
GFR, EST AFRICAN AMERICAN: 38 mL/min — AB (ref >60)
Glucose, Bld: 284 mg/dL — ABNORMAL HIGH (ref 65–99)
POTASSIUM: 3.9 mmol/L (ref 3.5–5.1)
SODIUM: 142 mmol/L (ref 135–145)

## 2017-02-14 LAB — AMMONIA: Ammonia: 48 umol/L — ABNORMAL HIGH (ref 9–35)

## 2017-02-14 LAB — ANA W/REFLEX IF POSITIVE: Anti Nuclear Antibody(ANA): NEGATIVE

## 2017-02-14 LAB — C3 COMPLEMENT: C3 Complement: 70 mg/dL — ABNORMAL LOW (ref 82–167)

## 2017-02-14 LAB — PARATHYROID HORMONE, INTACT (NO CA): PTH: 22 pg/mL (ref 15–65)

## 2017-02-14 LAB — C4 COMPLEMENT: Complement C4, Body Fluid: 13 mg/dL — ABNORMAL LOW (ref 14–44)

## 2017-02-14 MED ORDER — ALBUMIN HUMAN 5 % IV SOLN: 25.0000 g | Freq: Three times a day (TID) | INTRAVENOUS | Status: DC

## 2017-02-14 MED ORDER — LACTULOSE 10 GM/15ML PO SOLN
10.0000 g | Freq: Two times a day (BID) | ORAL | Status: DC
  Administered 2017-02-14 – 2017-02-15 (×2): 10 g via ORAL
  Filled 2017-02-14 (×2): qty 30

## 2017-02-14 MED ORDER — ALBUMIN HUMAN 25 % IV SOLN
50.0000 g | Freq: Three times a day (TID) | INTRAVENOUS | Status: AC
Start: 2017-02-14 — End: 2017-02-15
  Administered 2017-02-14 – 2017-02-15 (×3): 50 g via INTRAVENOUS
  Filled 2017-02-14 (×3): qty 200

## 2017-02-14 MED ORDER — VANCOMYCIN HCL 10 G IV SOLR
1250.0000 mg | INTRAVENOUS | Status: DC
  Administered 2017-02-14: 23:00:00 1250 mg via INTRAVENOUS
  Filled 2017-02-14 (×3): qty 1250

## 2017-02-14 MED ORDER — FERROUS SULFATE 325 (65 FE) MG PO TABS
325.0000 mg | ORAL_TABLET | Freq: Two times a day (BID) | ORAL | Status: DC
  Administered 2017-02-14 – 2017-02-17 (×6): 325 mg via ORAL
  Filled 2017-02-14 (×6): qty 1

## 2017-02-14 NOTE — Progress Notes (Signed)
Pt is ordered Lactulose d/t elevated ammonia level. Patient has had 3 loose BM's in the past 24 hours and denies abdominal pain. Pt placed on enteric precautions per policy and MD notified. Due to no c/o abd pain with loose BM's, enteric precautions discontinued. If patient c/o abd pain/cramping with loose bm's initiate enteric precautions and obtain stool sample.

## 2017-02-14 NOTE — Progress Notes (Signed)
[place] at [place] NAME: Roberto Perez    MR#:  [id number]  DATE OF BIRTH:  1951-07-17  SUBJECTIVE:  CHIEF COMPLAINT:   Chief Complaint  Patient presents with  . Anemia   -  Feels about the same. - Complains of leg pain, mental status more alert. Labs pending  REVIEW OF SYSTEMS:  Review of Systems  Constitutional: Positive for malaise/fatigue. Negative for chills and fever.  HENT: Negative for congestion, ear discharge, hearing loss and nosebleeds.   Eyes: Negative for blurred vision and double vision.  Respiratory: Negative for cough, shortness of breath and wheezing.   Cardiovascular: Positive for leg swelling. Negative for chest pain and palpitations.  Gastrointestinal: Positive for diarrhea. Negative for abdominal pain, constipation, nausea and vomiting.  Genitourinary: Negative for dysuria.  Neurological: Positive for tremors. Negative for dizziness, seizures and headaches.    DRUG ALLERGIES:  No Known Allergies  VITALS:  Blood pressure (!) 99/39, pulse 66, temperature 97.4 F (36.3 C), temperature source Oral, resp. rate 18, height 6\' 2"  (1.88 m), weight 117.5 kg (259 lb 1.6 oz), SpO2 97 %.  PHYSICAL EXAMINATION:  Physical Exam  GENERAL:  66 y.o.-year-old patient lying in the bed with no acute distress.  EYES: Pupils equal, round, reactive to light and accommodation. No scleral icterus. Extraocular muscles intact.  HEENT: Head atraumatic, normocephalic. Oropharynx and nasopharynx clear.  NECK:  Supple, no jugular venous distention. No thyroid enlargement, no tenderness.  LUNGS: Normal breath sounds bilaterally, no wheezing, rales,rhonchi or crepitation. No use of accessory muscles of respiration.  CARDIOVASCULAR: S1, S2 normal. No murmurs, rubs, or gallops.  ABDOMEN: Soft, distended, nontender. Bowel sounds present. No organomegaly or mass.  EXTREMITIES: No cyanosis, or clubbing. 1+ pedal edema NEUROLOGIC: Cranial  nerves II through XII are intact. Muscle strength 5/5 in all extremities. Sensation intact. Gait not checked. Global weakness noted. PSYCHIATRIC: The patient is alert and oriented x 2.  SKIN: No obvious rash, lesion, or ulcer.    LABORATORY PANEL:   CBC  Recent Labs Lab 02/14/17 1127  WBC 3.3*  HGB 8.3*  HCT 25.8*  PLT 69*   ------------------------------------------------------------------------------------------------------------------  Chemistries   Recent Labs Lab 02/12/17 0515  02/14/17 1127  NA 137  < > 142  K 4.0  < > 3.9  CL 103  < > 110  CO2 27  < > 26  GLUCOSE 157*  < > 284*  BUN 78*  < > 63*  CREATININE 2.98*  < > 2.01*  CALCIUM 8.7*  < > 8.5*  AST 18  --   --   ALT 13*  --   --   ALKPHOS 116  --   --   BILITOT 1.3*  --   --   < > = values in this interval not displayed. ------------------------------------------------------------------------------------------------------------------  Cardiac Enzymes No results for input(s): TROPONINI in the last 168 hours. ------------------------------------------------------------------------------------------------------------------  RADIOLOGY:  US Renal  Result Date: 02/13/2017 CLINICAL DATA:  Acute renal failure. Ascites, status post paracentesis today. EXAM: RENAL / URINARY TRACT ULTRASOUND COMPLETE COMPARISON:  None. FINDINGS: Right Kidney: Length: 12.1 cm. Mildly increased cortical echogenicity. No mass or hydronephrosis visualized. Left Kidney: Length: 10.2 Cm. Mildly increased cortical echogenicity. No mass or hydronephrosis visualized. Bladder: Appears normal for degree of bladder distention. IMPRESSION: Echogenic kidneys compatible with medical renal disease Electronically Signed   By: Elon Alas M.D.   On: 02/13/2017 15:41   US Paracentesis  Result Date: 02/13/2017 INDICATION:  Ascites. EXAM: ULTRASOUND GUIDED therapeutic and diagnostic PARACENTESIS MEDICATIONS: None. COMPLICATIONS: None immediate.  PROCEDURE: Informed written consent was obtained from the patient after a discussion of the risks, benefits and alternatives to treatment. A timeout was performed prior to the initiation of the procedure. Initial ultrasound scanning demonstrates a large amount of ascites within the right lower abdominal quadrant. The right lower abdomen was prepped and draped in the usual sterile fashion. 1% lidocaine with epinephrine was used for local anesthesia. Following this, a Safe-T-Centesis catheter was introduced. An ultrasound image was saved for documentation purposes. The paracentesis was performed. The catheter was removed and a dressing was applied. The patient tolerated the procedure well without immediate post procedural complication. FINDINGS: A total of approximately 3 L of serous fluid was removed. Samples were sent to the laboratory as requested by the clinical team. IMPRESSION: Successful ultrasound-guided paracentesis yielding 3 liters of peritoneal fluid. Electronically Signed   By: Marijo Conception, M.D.   On: 02/13/2017 11:49    EKG:   Orders placed or performed during the hospital encounter of 02/12/17  . EKG 12-Lead  . EKG 12-Lead    ASSESSMENT AND PLAN:   MelvinElliottis a 66 y.o.malewith a known history of Severe iron deficiency anemia which is chronic has had several blood transfusions in the recent past most recent admission at [facility] from March 28 to April 3 for severe anemia where he ended up getting 7 units of blood transfusion. Patient has history of CK D stage III creatinine around 1.9, type 2 diabetes, nonalcoholic fatty liver disease/cirrhosis with portal hypertension and ascites and pancytopenia comes to the emergency room from [facility] with altered mental status.  1. Altered mental status/acute encephalopathy appears metabolic/hepatic/Sepsis -Ammonia of 163 on admission - on lactulose -improving mentation  2. Liver cirrhosis with ascites- -Patient has history of  nonalcoholic fatty liver disease/cirrhosis of liver with complicatedascites, coagulopathy, portal hypertension, esophageal varices, pancytopenia -s/p Paracentesis ultrasound-guided therapeutic---fluid positive for 2900 WBC c/w SBP (already on IV Rocephin) -IV albumin started today -GI consultation appreciated  3. Severe iron deficiency anemia with significant chronic blood loss anemia -Patient's baseline hemoglobin is 6-7 -He recently had 7 units of blood transfusion last week at [facility] -Extensive workup has been done at [facility] in the form of EGD and colonoscopy which showed no variances however has history of varices status post banding in the past -Capsule endoscopy showed multiple mucosal defects throughout the entire bowel -We'll continue Protonix daily. -Continue oral iron  4. Acute on chronic renal failure -Multi-factorial appears hepatorenal syndrome with poor intravascular volume along with history of diabetes -Nephrology consultation noted.  -Baseline creatinine 1.7-1.9. IV albumin ordered today - lasix and aldactone once renal function improves  5. MRSA/Strep Sepsis with UTI -IV Rocephin and IV vancomycin Follow-up urine cultures shows multiple species -ID consulted-spoke with Dr fitzgerald. ECHO with no vegetations. Repeat blood cultures ordered -might need PICC and long term IV ABX  6. DVT prophylaxis SCD teds Patient has chronic thrombocytopenia. Platelet Count 60,000 we'll avoid and a platelet agents.  7. Type 2 diabetes -Sliding-scale insulin  8. Patient carries a very poor prognosis given his multitude of medical problems . Palliative care consultation.pt is now DNR     All the records are reviewed and case discussed with Care Management/Social Workerr. Management plans discussed with the patient, family and they are in agreement.  CODE STATUS: Full Code  TOTAL TIME TAKING CARE OF THIS PATIENT: 32 minutes.   POSSIBLE D/C IN 2-3 DAYS, DEPENDING  ON CLINICAL  CONDITION.   Gladstone Lighter M.D on 02/14/2017 at 12:22 PM  Between 7am to 6pm - Pager - [phone]  After 6pm go to [email or web address] - password [email or web address] [facility]  Office  [phone]  CC: Primary care physician; Gayland Curry, MD

## 2017-02-14 NOTE — Evaluation (Signed)
Clinical/Bedside Swallow Evaluation Patient Details  Name: Roberto Perez MRN: [id number] Date of Birth: [birth year]  Today's Date: 02/14/2017 Time: SLP Start Time (ACUTE ONLY): 1000 SLP Stop Time (ACUTE ONLY): 1100 SLP Time Calculation (min) (ACUTE ONLY): 60 min  Past Medical History:  Past Medical History:  Diagnosis Date  . Anemia    Past Surgical History: History reviewed. No pertinent surgical history. HPI:  Pt presented to ER from [facility] secondary to AMS; admitted with acute metabolic/hepatic encephalopathy, UTI.  Of note, patient with recent hospitalization at [facility] (3/28-4/3) due to anemia, requiring 7 units PRBC; discharged to [facility]. PT and nsg report that pt has been coughing while drinking liquids.    Assessment / Plan / Recommendation Clinical Impression  Pt presents w/oropharyngeal dysphagia c/b overt s/s of aspiration w/thin water and mild oral phase deficits with solid consistency. Pt judged to be at moderate aspiration risk d/t suspected delayed swallow initiation with thin liquids and mild oral phase deficits from poor dentition. Pt given trials of ice chips, water, nectar thick, puree and solid consistencies. Pt demonstrated immediate and delayed cough response following sips of water by cup and by straw. Attempted cued small sips, however pt again demonstrated overt cough. No overt s/s of aspiration were observed w/ice chips, nectar, puree or solid consistencies. Laryngeal elevation appeared adequate and vocal quality remained clear throughout other trials. Pt also demonstrated mildly increased mastication time with solid consistencies likely d/t missing dentition. Pt was still able to adequately clear given increased time. Oral mech exam revealed oral motor abilities to be [facility]. Recommend dysphagia III diet w/nectar thick liquids and ice chips for pleasure. Aspiration precautions. Will f/u in 1-2 days re: tolertion of diet and pt may benefit from MBSS to determine least restrictive safe  diet, especially given h/o dysphagia prior to admission.  SLP Visit Diagnosis: Dysphagia, oropharyngeal phase (R13.12)    Aspiration Risk  Moderate aspiration risk    Diet Recommendation Dysphagia 3 (Mech soft);Nectar-thick liquid;Other (Comment) (Ice chips for pleasure)   Liquid Administration via: Cup Medication Administration: Whole meds with puree Supervision: Patient able to self feed Compensations: Minimize environmental distractions;Slow rate;Small sips/bites Postural Changes: Seated upright at 90 degrees;Remain upright for at least 30 minutes after po intake    Other  Recommendations Oral Care Recommendations: Oral care BID   Follow up Recommendations Skilled Nursing facility      Frequency and Duration min 3x week  1 week       Prognosis Prognosis for Safe Diet Advancement: Fair Barriers to Reach Goals: Cognitive deficits      Swallow Study   General Date of Onset: 02/13/17 HPI: Pt presented to ER from [facility] secondary to AMS; admitted with acute metabolic/hepatic encephalopathy, UTI.  Of note, patient with recent hospitalization at [facility] (3/28-4/3) due to anemia, requiring 7 units PRBC; discharged to STR. PT and nsg report that pt has been coughing while drinking liquids.  Type of Study: Bedside Swallow Evaluation Previous Swallow Assessment: Pt reports that he has been seen by ST at [facility]. Pt denies having a previous MBSS.  Diet Prior to this Study: Regular;Thin liquids Temperature Spikes Noted: No Respiratory Status: Room air History of Recent Intubation: No Behavior/Cognition: Alert;Cooperative;Pleasant mood Oral Cavity Assessment: Within Functional Limits Oral Care Completed by SLP: No Oral Cavity - Dentition: Dentures, top;Missing dentition;Other (Comment) (Many missing lower teeth) Vision: Functional for self-feeding Self-Feeding Abilities: Able to feed self;Needs set up Patient Positioning: Upright in chair Baseline Vocal Quality: Normal Volitional Cough:  Strong Volitional Swallow:  Able to elicit    Oral/Motor/Sensory Function Overall Oral Motor/Sensory Function: Within functional limits   Ice Chips Ice chips: Within functional limits Presentation: Spoon;Self Fed Other Comments: No overt s/s of aspiration were observed w/several tsps of ice chips.    Thin Liquid Thin Liquid: Impaired Presentation: Cup;Self Fed;Straw Pharyngeal  Phase Impairments: Cough - Immediate;Cough - Delayed Other Comments: Pt demonstrated immediate and delayed cough with sips of thin water by straw and cup. Encouraged pt to take very small "coffee" size sips, however pt still demonstrated overt s/s of aspiration.    Nectar Thick Nectar Thick Liquid: Within functional limits Presentation: Cup;Self Fed Other Comments: Pt demonstrated no overt s/s of aspiration w/several sips of nectar by cup.   Honey Thick Honey Thick Liquid: Not tested   Puree Puree: Within functional limits Presentation: Self Fed;Spoon Other Comments: No overt s/s of aspiration observed w/2 ounces of puree.    Solid   GO   Solid: Impaired Presentation: Self Fed Oral Phase Impairments: Other (comment) (Mildly increased mastication time. )        [place],Keagen Heinlen 02/14/2017,11:23 AM

## 2017-02-14 NOTE — Evaluation (Signed)
Physical Therapy Evaluation Patient Details Name: Roberto Perez MRN: [id number] DOB: 1951-06-21 Today's Date: 02/14/2017   History of Present Illness  presented to ER from STR secondary to AMS; admitted with acute metabolic/hepatic encephalopathy, UTI.  Of note, patient with recent hospitalization at [facility] (3/28-4/3) due to anemia, requiring 7 units PRBC; discharged to STR.  Clinical Impression  Upon evaluation, patient alert and oriented to basic information; follows simple commands with mild delay in processing/response time.  Bilat UE strength and ROM grossly WFL; LEs generally weak and deconditioned, requiring act assist from therapist to move throughout full, available range (L ankle lacking approx 5 degrees of passive DF).  Currently requiring min assist for supine/sit, sit/stand and short-distance gait (5') with RW; limited active use of L LE noted.  Assist from therapist for both lift off and standing balance during all mobility efforts.  Requires +1 at all times for optimal safety. Mildly hypotensive throughout session, but vitals stable during transition to upright.  No signs/symptoms of orthostasis noted. Would benefit from skilled PT to address above deficits and promote optimal return to PLOF; recommend transition to STR upon discharge from acute hospitalization.  Of note, did not excessive coughing when taking thin liquids; RN and ST informed/aware.  ST consult pending.    Follow Up Recommendations SNF    Equipment Recommendations       Recommendations for Other Services       Precautions / Restrictions Precautions Precautions: Fall Precaution Comments: Contact iso Restrictions Weight Bearing Restrictions: No      Mobility  Bed Mobility Overal bed mobility: Needs Assistance Bed Mobility: Supine to Sit     Supine to sit: Min assist     General bed mobility comments: heavy use of bedrails, min assist from therapist for truncal elevation  Transfers Overall transfer  level: Needs assistance Equipment used: Rolling walker (2 wheeled) Transfers: Sit to/from Stand Sit to Stand: Min assist         General transfer comment: increased time, use of bilat UEs and momentum for lift off  Ambulation/Gait Ambulation/Gait assistance: Min assist Ambulation Distance (Feet): 5 Feet Assistive device: Rolling walker (2 wheeled)       General Gait Details: forward flexed posture, broad BOS with short, shuffling steps; hands-on assist for optimal safety  Stairs            Wheelchair Mobility    Modified Rankin (Stroke Patients Only)       Balance Overall balance assessment: Needs assistance Sitting-balance support: No upper extremity supported;Feet supported Sitting balance-Leahy Scale: Good     Standing balance support: Bilateral upper extremity supported Standing balance-Leahy Scale: Fair                               Pertinent Vitals/Pain Pain Assessment: No/denies pain    Home Living Family/patient expects to be discharged to:: Skilled nursing facility Living Arrangements: Other relatives               Additional Comments: At baseline, lives with brother in single-family home; one-level with 8-10 steps to enter. Brother works 3rd shift; patient home alone while brother working.  Has been at [facility] since discharge from [facility]; anticipates return for completion of rehab course upon discharge from this hospitalization.    Prior Function Level of Independence: Independent with assistive device(s)         Comments: Mod indep with 4WRW for ADLs, household activity at baseline; reports 1-2  falls in previous six months.     Hand Dominance        Extremity/Trunk Assessment   Upper Extremity Assessment Upper Extremity Assessment: Overall WFL for tasks assessed    Lower Extremity Assessment Lower Extremity Assessment: Generalized weakness (grossly 3+ to 4-/5 throughout; L LE generally edematous (?lymphadema?) with  scarring noted to distal LEs (L > R))       Communication   Communication: No difficulties  Cognition Arousal/Alertness: Awake/alert Behavior During Therapy: WFL for tasks assessed/performed Overall Cognitive Status: Within Functional Limits for tasks assessed                                 General Comments: delayed processing and response time      General Comments      Exercises Other Exercises Other Exercises: Sit/stand from edge of bed with RW, min assist; sit/stand from recliner (x3) with RW, min assist.  Increased assist from lower surfaces heights.  Decreased active use of L LE with movement transitions Other Exercises: Standing balance for use of urinal, min assist; requires unilateral UE support for standing balance/safety.   Assessment/Plan    PT Assessment Patient needs continued PT services  PT Problem List Decreased strength;Decreased range of motion;Decreased activity tolerance;Decreased balance;Decreased mobility;Decreased coordination;Decreased cognition;Decreased knowledge of use of DME;Decreased safety awareness;Decreased knowledge of precautions;Cardiopulmonary status limiting activity;Decreased skin integrity       PT Treatment Interventions DME instruction;Gait training;Stair training;Functional mobility training;Therapeutic activities;Therapeutic exercise;Balance training;Patient/family education    PT Goals (Current goals can be found in the Care Plan section)  Acute Rehab PT Goals Patient Stated Goal: to return to rehab PT Goal Formulation: With patient Time For Goal Achievement: 02/28/17 Potential to Achieve Goals: Good    Frequency Min 2X/week   Barriers to discharge Decreased caregiver support      Co-evaluation               End of Session Equipment Utilized During Treatment: Gait belt Activity Tolerance: Patient tolerated treatment well Patient left: in chair;with call bell/phone within reach;with chair alarm  set Nurse Communication: Mobility status PT Visit Diagnosis: Muscle weakness (generalized) (M62.81);Unsteadiness on feet (R26.81)    Time: 3299-2426 PT Time Calculation (min) (ACUTE ONLY): 29 min   Charges:   PT Evaluation $PT Eval Low Complexity: 1 Procedure PT Treatments $Therapeutic Activity: 8-22 mins   PT G Codes:        Roberto Perez, PT, DPT, NCS 02/14/17, 10:38 AM [phone]

## 2017-02-14 NOTE — Progress Notes (Signed)
ANTIBIOTIC CONSULT NOTE  Pharmacy Consult for Vancomycin Indication: bacteremia  No Known Allergies  Patient Measurements: Height: 6\' 2"  (188 cm) Weight: 259 lb 1.6 oz (117.5 kg) IBW/kg (Calculated) : 82.2 Adjusted Body Weight: 96.32 kg   Vital Signs: Temp: 97.4 F (36.3 C) (04/07 0937) Temp Source: Oral (04/07 0937) BP: 99/39 (04/07 0947) Pulse Rate: 66 (04/07 0937) Intake/Output from previous day: 04/06 0701 - 04/07 0700 In: 1622.5 [I.V.:1322.5; IV Piggyback:300] Out: 0962 [Urine:1050; Stool:1] Intake/Output from this shift: Total I/O In: 50 [IV Piggyback:50] Out: 100 [Urine:100]  Labs:  Recent Labs  02/12/17 0515 02/13/17 0520 02/13/17 1752 02/14/17 0627 02/14/17 1127  WBC 3.8  --   --   --  3.3*  HGB 7.7*  --   --   --  8.3*  PLT 60*  --   --   --  69*  LABCREA  --   --  77  --   --   CREATININE 2.98* 2.71*  --  2.06* 2.01*   Estimated Creatinine Clearance: 49.2 mL/min (A) (by C-G formula based on SCr of 2.01 mg/dL (H)). No results for input(s): VANCOTROUGH, VANCOPEAK, VANCORANDOM, GENTTROUGH, GENTPEAK, GENTRANDOM, TOBRATROUGH, TOBRAPEAK, TOBRARND, AMIKACINPEAK, AMIKACINTROU, AMIKACIN in the last 72 hours.   Microbiology: Recent Results (from the past 720 hour(s))  Blood culture (routine x 2)     Status: Abnormal (Preliminary result)   Collection Time: 02/12/17  5:17 AM  Result Value Ref Range Status   Specimen Description BLOOD R FA  Final   Special Requests   Final    BOTTLES DRAWN AEROBIC AND ANAEROBIC Blood Culture adequate volume   Culture  Setup Time   Final    GRAM POSITIVE COCCI IN BOTH AEROBIC AND ANAEROBIC BOTTLES CRITICAL RESULT CALLED TO, READ BACK BY AND VERIFIED WITH: JASON ROBBINS AT 2037 02/12/2017 BY TFK MATT MCBANE @ 8366 ON 02/13/2017 BY CAF    Culture (A)  Final    STAPHYLOCOCCUS AUREUS GROUP B STREP(S.AGALACTIAE)ISOLATED CULTURE REINCUBATED FOR BETTER GROWTH Performed at [facility], [place] [address] [place], [place]  [address]    Report Status PENDING  Incomplete  Blood culture (routine x 2)     Status: None (Preliminary result)   Collection Time: 02/12/17  5:17 AM  Result Value Ref Range Status   Specimen Description BLOOD R H  Final   Special Requests   Final    BOTTLES DRAWN AEROBIC AND ANAEROBIC Blood Culture adequate volume   Culture  Setup Time   Final    GRAM POSITIVE COCCI ANAEROBIC BOTTLE ONLY CRITICAL RESULT CALLED TO, READ BACK BY AND VERIFIED WITH: MATT MCBANE @ 5465 ON 02/13/2017 BY CAF    Culture   Final    GRAM POSITIVE COCCI CULTURE REINCUBATED FOR BETTER GROWTH Performed at [facility], [place] [address], [place], [place] [address]    Report Status PENDING  Incomplete  Urine culture     Status: Abnormal   Collection Time: 02/12/17  5:17 AM  Result Value Ref Range Status   Specimen Description URINE, RANDOM  Final   Special Requests NONE  Final   Culture MULTIPLE SPECIES PRESENT, SUGGEST RECOLLECTION (A)  Final   Report Status 02/13/2017 FINAL  Final  Blood Culture ID Panel (Reflexed)     Status: Abnormal   Collection Time: 02/12/17  5:17 AM  Result Value Ref Range Status   Enterococcus species NOT DETECTED NOT DETECTED Final   Listeria monocytogenes NOT DETECTED NOT DETECTED Final  Staphylococcus species DETECTED (A) NOT DETECTED Final    Comment: CRITICAL RESULT CALLED TO, READ BACK BY AND VERIFIED WITH: JASON ROBBINS AT 2037 02/12/2017 BY TFK    Staphylococcus aureus DETECTED (A) NOT DETECTED Final    Comment: Methicillin (oxacillin)-resistant Staphylococcus aureus (MRSA). MRSA is predictably resistant to beta-lactam antibiotics (except ceftaroline). Preferred therapy is vancomycin unless clinically contraindicated. Patient requires contact precautions if  hospitalized. CRITICAL RESULT CALLED TO, READ BACK BY AND VERIFIED WITH: JASON ROBBINS AT 2037 02/12/2017 BY TFK    Methicillin resistance DETECTED (A) NOT DETECTED Final    Comment: CRITICAL RESULT CALLED TO, READ  BACK BY AND VERIFIED WITH: JASON ROBBINS AT 2037 02/12/2017 BY TFK    Streptococcus species DETECTED (A) NOT DETECTED Final    Comment: CRITICAL RESULT CALLED TO, READ BACK BY AND VERIFIED WITH: JASON ROBBINS AT 2037 02/12/2017 BY TFK    Streptococcus agalactiae DETECTED (A) NOT DETECTED Final    Comment: CRITICAL RESULT CALLED TO, READ BACK BY AND VERIFIED WITH: JASON ROBBINS AT 2037 02/12/2017 BY TFK    Streptococcus pneumoniae NOT DETECTED NOT DETECTED Final   Streptococcus pyogenes NOT DETECTED NOT DETECTED Final   Acinetobacter baumannii NOT DETECTED NOT DETECTED Final   Enterobacteriaceae species NOT DETECTED NOT DETECTED Final   Enterobacter cloacae complex NOT DETECTED NOT DETECTED Final   Escherichia coli NOT DETECTED NOT DETECTED Final   Klebsiella oxytoca NOT DETECTED NOT DETECTED Final   Klebsiella pneumoniae NOT DETECTED NOT DETECTED Final   Proteus species NOT DETECTED NOT DETECTED Final   Serratia marcescens NOT DETECTED NOT DETECTED Final   Haemophilus influenzae NOT DETECTED NOT DETECTED Final   Neisseria meningitidis NOT DETECTED NOT DETECTED Final   Pseudomonas aeruginosa NOT DETECTED NOT DETECTED Final   Candida albicans NOT DETECTED NOT DETECTED Final   Candida glabrata NOT DETECTED NOT DETECTED Final   Candida krusei NOT DETECTED NOT DETECTED Final   Candida parapsilosis NOT DETECTED NOT DETECTED Final   Candida tropicalis NOT DETECTED NOT DETECTED Final  MRSA PCR Screening     Status: None   Collection Time: 02/12/17  3:03 PM  Result Value Ref Range Status   MRSA by PCR NEGATIVE NEGATIVE Final    Comment:        The GeneXpert MRSA Assay (FDA approved for NASAL specimens only), is one component of a comprehensive MRSA colonization surveillance program. It is not intended to diagnose MRSA infection nor to guide or monitor treatment for MRSA infections.   Aerobic/Anaerobic Culture (surgical/deep wound)     Status: None (Preliminary result)   Collection  Time: 02/13/17  3:00 PM  Result Value Ref Range Status   Specimen Description WOUND  Final   Special Requests NO ANAEROBIC SWAB SENT  Final   Gram Stain   Final    FEW WBC PRESENT,BOTH PMN AND MONONUCLEAR NO ORGANISMS SEEN    Culture   Final    CULTURE REINCUBATED FOR BETTER GROWTH Performed at [facility], [place] [address] [place], [place] [address]    Report Status PENDING  Incomplete  Culture, blood (single) w Reflex to ID Panel     Status: None (Preliminary result)   Collection Time: 02/13/17  5:28 PM  Result Value Ref Range Status   Specimen Description BLOOD L AC  Final   Special Requests BOTTLES DRAWN AEROBIC AND ANAEROBIC [place]  Final   Culture NO GROWTH < 24 HOURS  Final   Report Status PENDING  Incomplete    Medical History:  Past Medical History:  Diagnosis Date  . Anemia     Medications:  Prescriptions Prior to Admission  Medication Sig Dispense Refill Last Dose  . allopurinol (ZYLOPRIM) 300 MG tablet Take 300 mg by mouth daily.   02/11/2017 at 0800  . carvedilol (COREG) 3.125 MG tablet Take 3.125 mg by mouth 2 (two) times daily.   02/11/2017 at 2000  . glipiZIDE (GLUCOTROL XL) 10 MG 24 hr tablet Take 10 mg by mouth daily.   02/11/2017 at 0800  . lactulose (CHRONULAC) 10 GM/15ML solution Take 15 mLs by mouth 3 (three) times daily.   02/11/2017 at 2000  . LEVEMIR 100 UNIT/ML injection Inject 20 Units into the skin daily.   02/11/2017 at 0800  . levothyroxine (SYNTHROID, LEVOTHROID) 200 MCG tablet Take 200 mcg by mouth daily.   02/12/2017 at 0600  . pantoprazole (PROTONIX) 40 MG tablet Take 40 mg by mouth 2 (two) times daily.   02/11/2017 at 2000  . spironolactone (ALDACTONE) 100 MG tablet Take 50 mg by mouth daily.   02/11/2017 at 0800   Assessment: Pharmacy consulted to dose vancomycin in this 66 year old male with blood Cx growing Strep agalactiae and Staph A (Mech A +) per BCID (GPC in 2/2). Patient is also on ceftriaxone for UTI and possible SBP.   Goal of Therapy:   Vancomycin trough level 15-20 mcg/ml  Plan:  Expected duration 7 days with resolution of temperature and/or normalization of WBC  Will continue vancomycin 1250 mg iv q 24 hours with planned trough 4/9. SCr ordered 4/7. Will continue to follow and adjust dosing/order levels as clinically indicated.   ADD: Changed Vancomycin to 1250 mg IV q18 hours. Scr ordered with am labs. Likely will need to change regimen again if patient's renal function continues.   Catherine Oak D 02/14/2017,1:40 PM

## 2017-02-14 NOTE — Progress Notes (Signed)
Roberto Perez is being followed for SBP Day 2 of follow up   Subjective: Patient does feel any better than when he presented to the hospital  He complains about diarrhea   Objective: Vital signs in last 24 hours: Vitals:   02/13/17 1318 02/13/17 2003 02/14/17 0451 02/14/17 0937  BP: (!) 102/50 (!) 108/50 (!) 110/49 (!) 99/39  Pulse: 63 66 67 66  Resp: 20 (!) 21 20 18   Temp: 97.7 F (36.5 C) 97.9 F (36.6 C) 97.5 F (36.4 C) 97.4 F (36.3 C)  TempSrc: Oral  Oral Oral  SpO2: 98% 98% 96% 97%  Weight:      Height:       Weight change:   Intake/Output Summary (Last 24 hours) at 02/14/17 1153 Last data filed at 02/14/17 5621  Gross per 24 hour  Intake           1622.5 ml  Output             1151 ml  Net            471.5 ml     Exam: Gen: older white male sitting up in a recliner in NAD HEENT: sclera anicteric, no head and neck lymphadenopathy, OP clear with dry MM Heart:: RRR Lungs: no respiratory distress Abdomen: +BS, soft, NT, ND, +ascites fluid Ext: warm without edema Neuro: alert, responding to questions appropriately, no asterixis  Lab Results: @LABTEST2 @ Micro Results: Recent Results (from the past 240 hour(s))  Blood culture (routine x 2)     Status: Abnormal (Preliminary result)   Collection Time: 02/12/17  5:17 AM  Result Value Ref Range Status   Specimen Description BLOOD R FA  Final   Special Requests   Final    BOTTLES DRAWN AEROBIC AND ANAEROBIC Blood Culture adequate volume   Culture  Setup Time   Final    GRAM POSITIVE COCCI IN BOTH AEROBIC AND ANAEROBIC BOTTLES CRITICAL RESULT CALLED TO, READ BACK BY AND VERIFIED WITH: JASON ROBBINS AT 2037 02/12/2017 BY TFK MATT MCBANE @ 0346 ON 02/13/2017 BY CAF    Culture (A)  Final    STAPHYLOCOCCUS AUREUS GROUP B STREP(S.AGALACTIAE)ISOLATED CULTURE REINCUBATED FOR BETTER GROWTH Performed at [facility], [address], [place], [place] [address]    Report Status PENDING  Incomplete  Blood  culture (routine x 2)     Status: None (Preliminary result)   Collection Time: 02/12/17  5:17 AM  Result Value Ref Range Status   Specimen Description BLOOD R H  Final   Special Requests   Final    BOTTLES DRAWN AEROBIC AND ANAEROBIC Blood Culture adequate volume   Culture  Setup Time   Final    GRAM POSITIVE COCCI ANAEROBIC BOTTLE ONLY CRITICAL RESULT CALLED TO, READ BACK BY AND VERIFIED WITH: MATT MCBANE @ 7846 ON 02/13/2017 BY CAF    Culture   Final    GRAM POSITIVE COCCI CULTURE REINCUBATED FOR BETTER GROWTH Performed at [facility], [place] [address] [place], [place] [address]    Report Status PENDING  Incomplete  Urine culture     Status: Abnormal   Collection Time: 02/12/17  5:17 AM  Result Value Ref Range Status   Specimen Description URINE, RANDOM  Final   Special Requests NONE  Final   Culture MULTIPLE SPECIES PRESENT, SUGGEST RECOLLECTION (A)  Final   Report Status 02/13/2017 FINAL  Final  Blood Culture ID Panel (Reflexed)     Status: Abnormal  Collection Time: 02/12/17  5:17 AM  Result Value Ref Range Status   Enterococcus species NOT DETECTED NOT DETECTED Final   Listeria monocytogenes NOT DETECTED NOT DETECTED Final   Staphylococcus species DETECTED (A) NOT DETECTED Final    Comment: CRITICAL RESULT CALLED TO, READ BACK BY AND VERIFIED WITH: JASON ROBBINS AT 2037 02/12/2017 BY TFK    Staphylococcus aureus DETECTED (A) NOT DETECTED Final    Comment: Methicillin (oxacillin)-resistant Staphylococcus aureus (MRSA). MRSA is predictably resistant to beta-lactam antibiotics (except ceftaroline). Preferred therapy is vancomycin unless clinically contraindicated. Patient requires contact precautions if  hospitalized. CRITICAL RESULT CALLED TO, READ BACK BY AND VERIFIED WITH: JASON ROBBINS AT 2037 02/12/2017 BY TFK    Methicillin resistance DETECTED (A) NOT DETECTED Final    Comment: CRITICAL RESULT CALLED TO, READ BACK BY AND VERIFIED WITH: JASON ROBBINS AT 2037  02/12/2017 BY TFK    Streptococcus species DETECTED (A) NOT DETECTED Final    Comment: CRITICAL RESULT CALLED TO, READ BACK BY AND VERIFIED WITH: JASON ROBBINS AT 2037 02/12/2017 BY TFK    Streptococcus agalactiae DETECTED (A) NOT DETECTED Final    Comment: CRITICAL RESULT CALLED TO, READ BACK BY AND VERIFIED WITH: JASON ROBBINS AT 2037 02/12/2017 BY TFK    Streptococcus pneumoniae NOT DETECTED NOT DETECTED Final   Streptococcus pyogenes NOT DETECTED NOT DETECTED Final   Acinetobacter baumannii NOT DETECTED NOT DETECTED Final   Enterobacteriaceae species NOT DETECTED NOT DETECTED Final   Enterobacter cloacae complex NOT DETECTED NOT DETECTED Final   Escherichia coli NOT DETECTED NOT DETECTED Final   Klebsiella oxytoca NOT DETECTED NOT DETECTED Final   Klebsiella pneumoniae NOT DETECTED NOT DETECTED Final   Proteus species NOT DETECTED NOT DETECTED Final   Serratia marcescens NOT DETECTED NOT DETECTED Final   Haemophilus influenzae NOT DETECTED NOT DETECTED Final   Neisseria meningitidis NOT DETECTED NOT DETECTED Final   Pseudomonas aeruginosa NOT DETECTED NOT DETECTED Final   Candida albicans NOT DETECTED NOT DETECTED Final   Candida glabrata NOT DETECTED NOT DETECTED Final   Candida krusei NOT DETECTED NOT DETECTED Final   Candida parapsilosis NOT DETECTED NOT DETECTED Final   Candida tropicalis NOT DETECTED NOT DETECTED Final  MRSA PCR Screening     Status: None   Collection Time: 02/12/17  3:03 PM  Result Value Ref Range Status   MRSA by PCR NEGATIVE NEGATIVE Final    Comment:        The GeneXpert MRSA Assay (FDA approved for NASAL specimens only), is one component of a comprehensive MRSA colonization surveillance program. It is not intended to diagnose MRSA infection nor to guide or monitor treatment for MRSA infections.   Aerobic/Anaerobic Culture (surgical/deep wound)     Status: None (Preliminary result)   Collection Time: 02/13/17  3:00 PM  Result Value Ref Range  Status   Specimen Description WOUND  Final   Special Requests NO ANAEROBIC SWAB SENT  Final   Gram Stain   Final    FEW WBC PRESENT,BOTH PMN AND MONONUCLEAR NO ORGANISMS SEEN    Culture   Final    CULTURE REINCUBATED FOR BETTER GROWTH Performed at [facility], [place] [address] [place], [place] [address]    Report Status PENDING  Incomplete  Culture, blood (single) w Reflex to ID Panel     Status: None (Preliminary result)   Collection Time: 02/13/17  5:28 PM  Result Value Ref Range Status   Specimen Description BLOOD L AC  Final  Special Requests BOTTLES DRAWN AEROBIC AND ANAEROBIC [place]  Final   Culture NO GROWTH < 24 HOURS  Final   Report Status PENDING  Incomplete   Studies/Results: US Renal  Result Date: 02/13/2017 CLINICAL DATA:  Acute renal failure. Ascites, status post paracentesis today. EXAM: RENAL / URINARY TRACT ULTRASOUND COMPLETE COMPARISON:  None. FINDINGS: Right Kidney: Length: 12.1 cm. Mildly increased cortical echogenicity. No mass or hydronephrosis visualized. Left Kidney: Length: 10.2 Cm. Mildly increased cortical echogenicity. No mass or hydronephrosis visualized. Bladder: Appears normal for degree of bladder distention. IMPRESSION: Echogenic kidneys compatible with medical renal disease Electronically Signed   By: Elon Alas M.D.   On: 02/13/2017 15:41   US Paracentesis  Result Date: 02/13/2017 INDICATION: Ascites. EXAM: ULTRASOUND GUIDED therapeutic and diagnostic PARACENTESIS MEDICATIONS: None. COMPLICATIONS: None immediate. PROCEDURE: Informed written consent was obtained from the patient after a discussion of the risks, benefits and alternatives to treatment. A timeout was performed prior to the initiation of the procedure. Initial ultrasound scanning demonstrates a large amount of ascites within the right lower abdominal quadrant. The right lower abdomen was prepped and draped in the usual sterile fashion. 1% lidocaine with epinephrine was used for  local anesthesia. Following this, a Safe-T-Centesis catheter was introduced. An ultrasound image was saved for documentation purposes. The paracentesis was performed. The catheter was removed and a dressing was applied. The patient tolerated the procedure well without immediate post procedural complication. FINDINGS: A total of approximately 3 L of serous fluid was removed. Samples were sent to the laboratory as requested by the clinical team. IMPRESSION: Successful ultrasound-guided paracentesis yielding 3 liters of peritoneal fluid. Electronically Signed   By: Marijo Conception, M.D.   On: 02/13/2017 11:49   Medications: I have reviewed the patient's current medications. Scheduled Meds: . albumin human  50 g Intravenous Q8H  . ammonium lactate   Topical BID  . cefTRIAXone (ROCEPHIN)  IV  1 g Intravenous Q24H  . insulin aspart  0-9 Units Subcutaneous TID WC  . lactulose  10 g Oral BID  . levothyroxine  200 mcg Oral BH-q7a  . multivitamin with minerals  1 tablet Oral Daily  . nystatin   Topical TID  . pantoprazole  40 mg Oral BID  . spironolactone  50 mg Oral Daily  . vancomycin  1,250 mg Intravenous Q24H   Continuous Infusions: . sodium chloride 50 mL/hr at 02/14/17 0523   PRN Meds:.haloperidol lactate, lactulose, ondansetron **OR** ondansetron (ZOFRAN) IV   Assessment: Active Problems:   Encephalopathy   Ascites   End stage liver disease (HCC)   Palliative care encounter   DNR (do not resuscitate)  66 yo M with a history notable for CKD and NAFLD cirrhosis c/b ascites and HE who presents for worsening encephalopathy and was found to have an AKI with numerous sources of infection: blood urine and ascites fluid. He is on treatment with ceftriaxone for SBP and UTI and Vancomycin for MRSA bacteremia. His renal function is improving over the hospitalization and mental status appears improved compared to admission notes.   Plan:  - given SBP with AKI, patient should receive albumin:  please give albumin 25% 50G IV q8h x3 today and then albumin 25% 25g IV q8h x3 in two days from now - continue antibiotics, patient should be on life long suppressive antibiotics for SBP - monitor CBC and CMP daily or qod at least - high protein diet - no utility in trending ammonia levels as it is not an indication  of encephalopathy - titrate lactulose to 3 soft BMs daily   LOS: 2 days   Lisbeth Renshaw 02/14/2017, 11:53 AM

## 2017-02-14 NOTE — Plan of Care (Signed)
Problem: Bowel/Gastric: Goal: Will not experience complications related to bowel motility Outcome: Not Progressing Pt is on lactulose for ammonia level, pt has had 3 watery BM's within the last 24 hours. Enteric precautions initiated per policy.

## 2017-02-14 NOTE — Progress Notes (Signed)
[facility]  ROUNDING NOTE   Subjective:  Renal function slowly improving.  BUN down to 63.  Cr currently 2.01.   Objective:  Vital signs in last 24 hours:  Temp:  [97.4 F (36.3 C)-97.9 F (36.6 C)] 97.4 F (36.3 C) (04/07 0937) Pulse Rate:  [66-67] 66 (04/07 0937) Resp:  [18-21] 18 (04/07 0937) BP: (99-110)/(39-50) 99/39 (04/07 0937) SpO2:  [96 %-98 %] 97 % (04/07 0937)  Weight change:  Filed Weights   02/12/17 0509 02/12/17 1230  Weight: 119.7 kg (264 lb) 117.5 kg (259 lb 1.6 oz)    Intake/Output: I/O last 3 completed shifts: In: 2485 [I.V.:2185; IV Piggyback:300] Out: 1401 [Urine:1400; Stool:1]   Intake/Output this shift:  Total I/O In: 50 [IV Piggyback:50] Out: 100 [Urine:100]  Physical Exam: General: No acute distress  Head: Normocephalic, atraumatic. Moist oral mucosal membranes  Eyes: Anicteric  Neck: Supple, trachea midline  Lungs:  Bilateral rhonchi, normal effort  Heart: S1S2 no rubs  Abdomen:  Soft, nontender, BS present  Extremities: trace peripheral edema.  Neurologic: Nonfocal, moving all four extremities  Skin: Scaly skin b/l LE's  Access:     Basic Metabolic Panel:  Recent Labs Lab 02/12/17 0515 02/13/17 0520 02/13/17 1302 02/14/17 0627 02/14/17 1127  NA 137 144  --   --  142  K 4.0 3.8  --   --  3.9  CL 103 111  --   --  110  CO2 27 25  --   --  26  GLUCOSE 157* 116*  --   --  284*  BUN 78* 73*  --   --  63*  CREATININE 2.98* 2.71*  --  2.06* 2.01*  CALCIUM 8.7* 8.7*  --   --  8.5*  PHOS  --   --  4.3  --   --     Liver Function Tests:  Recent Labs Lab 02/12/17 0515  AST 18  ALT 13*  ALKPHOS 116  BILITOT 1.3*  PROT 5.9*  ALBUMIN 2.6*   No results for input(s): LIPASE, AMYLASE in the last 168 hours.  Recent Labs Lab 02/12/17 0515 02/13/17 0520 02/14/17 1127  AMMONIA 163* 68* 48*    CBC:  Recent Labs Lab 02/12/17 0515 02/14/17 1127  WBC 3.8 3.3*  HGB 7.7* 8.3*  HCT 24.0* 25.8*  MCV 96.2  99.9  PLT 60* 69*    Cardiac Enzymes: No results for input(s): CKTOTAL, CKMB, CKMBINDEX, TROPONINI in the last 168 hours.  BNP: Invalid input(s): POCBNP  CBG:  Recent Labs Lab 02/13/17 1146 02/13/17 1650 02/13/17 2112 02/14/17 0742 02/14/17 1203  GLUCAP 150* 231* 271* 213* 289*    Microbiology: Results for orders placed or performed during the hospital encounter of 02/12/17  Blood culture (routine x 2)     Status: Abnormal (Preliminary result)   Collection Time: 02/12/17  5:17 AM  Result Value Ref Range Status   Specimen Description BLOOD R FA  Final   Special Requests   Final    BOTTLES DRAWN AEROBIC AND ANAEROBIC Blood Culture adequate volume   Culture  Setup Time   Final    GRAM POSITIVE COCCI IN BOTH AEROBIC AND ANAEROBIC BOTTLES CRITICAL RESULT CALLED TO, READ BACK BY AND VERIFIED WITH: JASON ROBBINS AT 2037 02/12/2017 BY TFK MATT MCBANE @ 0346 ON 02/13/2017 BY CAF    Culture (A)  Final    STAPHYLOCOCCUS AUREUS GROUP B STREP(S.AGALACTIAE)ISOLATED CULTURE REINCUBATED FOR BETTER GROWTH Performed at [facility], [place]  [address] [place], [place] [address]    Report Status PENDING  Incomplete  Blood culture (routine x 2)     Status: None (Preliminary result)   Collection Time: 02/12/17  5:17 AM  Result Value Ref Range Status   Specimen Description BLOOD R H  Final   Special Requests   Final    BOTTLES DRAWN AEROBIC AND ANAEROBIC Blood Culture adequate volume   Culture  Setup Time   Final    GRAM POSITIVE COCCI ANAEROBIC BOTTLE ONLY CRITICAL RESULT CALLED TO, READ BACK BY AND VERIFIED WITH: MATT MCBANE @ 9357 ON 02/13/2017 BY CAF    Culture   Final    GRAM POSITIVE COCCI CULTURE REINCUBATED FOR BETTER GROWTH Performed at [facility], [place] [address], [place], [place] [address]    Report Status PENDING  Incomplete  Urine culture     Status: Abnormal   Collection Time: 02/12/17  5:17 AM  Result Value Ref Range Status   Specimen Description URINE,  RANDOM  Final   Special Requests NONE  Final   Culture MULTIPLE SPECIES PRESENT, SUGGEST RECOLLECTION (A)  Final   Report Status 02/13/2017 FINAL  Final  Blood Culture ID Panel (Reflexed)     Status: Abnormal   Collection Time: 02/12/17  5:17 AM  Result Value Ref Range Status   Enterococcus species NOT DETECTED NOT DETECTED Final   Listeria monocytogenes NOT DETECTED NOT DETECTED Final   Staphylococcus species DETECTED (A) NOT DETECTED Final    Comment: CRITICAL RESULT CALLED TO, READ BACK BY AND VERIFIED WITH: JASON ROBBINS AT 2037 02/12/2017 BY TFK    Staphylococcus aureus DETECTED (A) NOT DETECTED Final    Comment: Methicillin (oxacillin)-resistant Staphylococcus aureus (MRSA). MRSA is predictably resistant to beta-lactam antibiotics (except ceftaroline). Preferred therapy is vancomycin unless clinically contraindicated. Patient requires contact precautions if  hospitalized. CRITICAL RESULT CALLED TO, READ BACK BY AND VERIFIED WITH: JASON ROBBINS AT 2037 02/12/2017 BY TFK    Methicillin resistance DETECTED (A) NOT DETECTED Final    Comment: CRITICAL RESULT CALLED TO, READ BACK BY AND VERIFIED WITH: JASON ROBBINS AT 2037 02/12/2017 BY TFK    Streptococcus species DETECTED (A) NOT DETECTED Final    Comment: CRITICAL RESULT CALLED TO, READ BACK BY AND VERIFIED WITH: JASON ROBBINS AT 2037 02/12/2017 BY TFK    Streptococcus agalactiae DETECTED (A) NOT DETECTED Final    Comment: CRITICAL RESULT CALLED TO, READ BACK BY AND VERIFIED WITH: JASON ROBBINS AT 2037 02/12/2017 BY TFK    Streptococcus pneumoniae NOT DETECTED NOT DETECTED Final   Streptococcus pyogenes NOT DETECTED NOT DETECTED Final   Acinetobacter baumannii NOT DETECTED NOT DETECTED Final   Enterobacteriaceae species NOT DETECTED NOT DETECTED Final   Enterobacter cloacae complex NOT DETECTED NOT DETECTED Final   Escherichia coli NOT DETECTED NOT DETECTED Final   Klebsiella oxytoca NOT DETECTED NOT DETECTED Final    Klebsiella pneumoniae NOT DETECTED NOT DETECTED Final   Proteus species NOT DETECTED NOT DETECTED Final   Serratia marcescens NOT DETECTED NOT DETECTED Final   Haemophilus influenzae NOT DETECTED NOT DETECTED Final   Neisseria meningitidis NOT DETECTED NOT DETECTED Final   Pseudomonas aeruginosa NOT DETECTED NOT DETECTED Final   Candida albicans NOT DETECTED NOT DETECTED Final   Candida glabrata NOT DETECTED NOT DETECTED Final   Candida krusei NOT DETECTED NOT DETECTED Final   Candida parapsilosis NOT DETECTED NOT DETECTED Final   Candida tropicalis NOT DETECTED NOT DETECTED Final  MRSA PCR Screening  Status: None   Collection Time: 02/12/17  3:03 PM  Result Value Ref Range Status   MRSA by PCR NEGATIVE NEGATIVE Final    Comment:        The GeneXpert MRSA Assay (FDA approved for NASAL specimens only), is one component of a comprehensive MRSA colonization surveillance program. It is not intended to diagnose MRSA infection nor to guide or monitor treatment for MRSA infections.   Aerobic/Anaerobic Culture (surgical/deep wound)     Status: None (Preliminary result)   Collection Time: 02/13/17  3:00 PM  Result Value Ref Range Status   Specimen Description WOUND  Final   Special Requests NO ANAEROBIC SWAB SENT  Final   Gram Stain   Final    FEW WBC PRESENT,BOTH PMN AND MONONUCLEAR NO ORGANISMS SEEN    Culture   Final    CULTURE REINCUBATED FOR BETTER GROWTH Performed at [facility], [place] [address], [place], [place] [address]    Report Status PENDING  Incomplete  Culture, blood (single) w Reflex to ID Panel     Status: None (Preliminary result)   Collection Time: 02/13/17  5:28 PM  Result Value Ref Range Status   Specimen Description BLOOD L AC  Final   Special Requests BOTTLES DRAWN AEROBIC AND ANAEROBIC [place]  Final   Culture NO GROWTH < 24 HOURS  Final   Report Status PENDING  Incomplete    Coagulation Studies:  Recent Labs  02/12/17 0515  LABPROT 19.5*   INR 1.63    Urinalysis:  Recent Labs  02/12/17 0517  COLORURINE YELLOW*  LABSPEC 1.012  PHURINE 5.0  GLUCOSEU NEGATIVE  HGBUR SMALL*  BILIRUBINUR NEGATIVE  KETONESUR NEGATIVE  PROTEINUR 30*  NITRITE NEGATIVE  LEUKOCYTESUR MODERATE*      Imaging: US Renal  Result Date: 02/13/2017 CLINICAL DATA:  Acute renal failure. Ascites, status post paracentesis today. EXAM: RENAL / URINARY TRACT ULTRASOUND COMPLETE COMPARISON:  None. FINDINGS: Right Kidney: Length: 12.1 cm. Mildly increased cortical echogenicity. No mass or hydronephrosis visualized. Left Kidney: Length: 10.2 Cm. Mildly increased cortical echogenicity. No mass or hydronephrosis visualized. Bladder: Appears normal for degree of bladder distention. IMPRESSION: Echogenic kidneys compatible with medical renal disease Electronically Signed   By: Elon Alas M.D.   On: 02/13/2017 15:41   US Paracentesis  Result Date: 02/13/2017 INDICATION: Ascites. EXAM: ULTRASOUND GUIDED therapeutic and diagnostic PARACENTESIS MEDICATIONS: None. COMPLICATIONS: None immediate. PROCEDURE: Informed written consent was obtained from the patient after a discussion of the risks, benefits and alternatives to treatment. A timeout was performed prior to the initiation of the procedure. Initial ultrasound scanning demonstrates a large amount of ascites within the right lower abdominal quadrant. The right lower abdomen was prepped and draped in the usual sterile fashion. 1% lidocaine with epinephrine was used for local anesthesia. Following this, a Safe-T-Centesis catheter was introduced. An ultrasound image was saved for documentation purposes. The paracentesis was performed. The catheter was removed and a dressing was applied. The patient tolerated the procedure well without immediate post procedural complication. FINDINGS: A total of approximately 3 L of serous fluid was removed. Samples were sent to the laboratory as requested by the clinical team.  IMPRESSION: Successful ultrasound-guided paracentesis yielding 3 liters of peritoneal fluid. Electronically Signed   By: Marijo Conception, M.D.   On: 02/13/2017 11:49     Medications:   . sodium chloride 50 mL/hr at 02/14/17 0523   . albumin human  50 g Intravenous Q8H  . ammonium lactate  Topical BID  . cefTRIAXone (ROCEPHIN)  IV  1 g Intravenous Q24H  . ferrous sulfate  325 mg Oral BID WC  . insulin aspart  0-9 Units Subcutaneous TID WC  . lactulose  10 g Oral BID  . levothyroxine  200 mcg Oral BH-q7a  . multivitamin with minerals  1 tablet Oral Daily  . nystatin   Topical TID  . pantoprazole  40 mg Oral BID  . spironolactone  50 mg Oral Daily  . vancomycin  1,250 mg Intravenous Q18H   haloperidol lactate, lactulose, ondansetron **OR** ondansetron (ZOFRAN) IV  Assessment/ Plan:  66 y.o. male with a PMHx of Diabetes mellitus type 2, cirrhosis, hypothyroidism, ascites, nonalcoholic fatty liver disease, iron deficiency anemia, chronic kidney disease stage III, who was admitted to [facility] on 02/12/2017 for evaluation of altered mental status.   1.  Acute renal failure, likely due to poor po intake, sepsis, UTI. 2.  Chronic kidney disease stage III baseline egfr 35. 3.  Sepsis, with MRSA and strep. 4.  Anemia of CKD.  5.  Cirrhosis of the liver.  Plan:  Renal function slowly improving. BUN down to 63 with a creatinine of 2.01. Continue IV fluid hydration with 0.9 normal saline at 50 cc per hour. Continue treatment of sepsis with vancomycin.  Hemoglobin has improved partially up to 8.3. Consider blood transfusion for hemoglobin of 7 or less. Overall patient continues to have a guarded long-term prognosis.   LOS: 2 Marieme Mcmackin 4/7/20182:10 PM

## 2017-02-15 LAB — COMPREHENSIVE METABOLIC PANEL
ALBUMIN: 3 g/dL — AB (ref 3.5–5.0)
ALT: 11 U/L — ABNORMAL LOW (ref 17–63)
ANION GAP: 5 (ref 5–15)
AST: 19 U/L (ref 15–41)
Alkaline Phosphatase: 83 U/L (ref 38–126)
BILIRUBIN TOTAL: 0.9 mg/dL (ref 0.3–1.2)
BUN: 53 mg/dL — ABNORMAL HIGH (ref 6–20)
CO2: 26 mmol/L (ref 22–32)
Calcium: 8.8 mg/dL — ABNORMAL LOW (ref 8.9–10.3)
Chloride: 112 mmol/L — ABNORMAL HIGH (ref 101–111)
Creatinine, Ser: 1.73 mg/dL — ABNORMAL HIGH (ref 0.61–1.24)
GFR calc Af Amer: 46 mL/min — ABNORMAL LOW (ref >60)
GFR calc non Af Amer: 39 mL/min — ABNORMAL LOW (ref >60)
GLUCOSE: 246 mg/dL — AB (ref 65–99)
POTASSIUM: 3.9 mmol/L (ref 3.5–5.1)
SODIUM: 143 mmol/L (ref 135–145)
TOTAL PROTEIN: 5.6 g/dL — AB (ref 6.5–8.1)

## 2017-02-15 LAB — GLUCOSE, CAPILLARY
GLUCOSE-CAPILLARY: 230 mg/dL — AB (ref 65–99)
GLUCOSE-CAPILLARY: 291 mg/dL — AB (ref 65–99)
GLUCOSE-CAPILLARY: 332 mg/dL — AB (ref 65–99)
Glucose-Capillary: 227 mg/dL — ABNORMAL HIGH (ref 65–99)

## 2017-02-15 LAB — MPO/PR-3 (ANCA) ANTIBODIES: ANCA Proteinase 3: 8.8 U/mL — ABNORMAL HIGH (ref 0.0–3.5)

## 2017-02-15 LAB — ABO/RH: ABO/RH(D): O NEG

## 2017-02-15 LAB — PREPARE RBC (CROSSMATCH)

## 2017-02-15 LAB — CULTURE, BLOOD (ROUTINE X 2): SPECIAL REQUESTS: ADEQUATE

## 2017-02-15 LAB — VANCOMYCIN, RANDOM: Vancomycin Rm: 40

## 2017-02-15 LAB — AEROBIC/ANAEROBIC CULTURE (SURGICAL/DEEP WOUND)

## 2017-02-15 LAB — CBC
HCT: 21.9 % — ABNORMAL LOW (ref 40.0–52.0)
Hemoglobin: 7 g/dL — ABNORMAL LOW (ref 13.0–18.0)
MCH: 32.1 pg (ref 26.0–34.0)
MCHC: 32 g/dL (ref 32.0–36.0)
MCV: 100.3 fL — ABNORMAL HIGH (ref 80.0–100.0)
PLATELETS: 52 10*3/uL — AB (ref 150–440)
RBC: 2.19 MIL/uL — AB (ref 4.40–5.90)
RDW: 25.2 % — ABNORMAL HIGH (ref 11.5–14.5)
WBC: 2.5 10*3/uL — AB (ref 3.8–10.6)

## 2017-02-15 LAB — AEROBIC/ANAEROBIC CULTURE W GRAM STAIN (SURGICAL/DEEP WOUND)

## 2017-02-15 MED ORDER — LACTULOSE 10 GM/15ML PO SOLN
10.0000 g | Freq: Three times a day (TID) | ORAL | Status: DC
  Administered 2017-02-15 – 2017-02-17 (×6): 10 g via ORAL
  Filled 2017-02-15 (×6): qty 30

## 2017-02-15 MED ORDER — SODIUM CHLORIDE 0.9 % IV SOLN
1250.0000 mg | INTRAVENOUS | Status: DC
  Administered 2017-02-16: 15:00:00 1250 mg via INTRAVENOUS
  Filled 2017-02-15 (×2): qty 1250

## 2017-02-15 MED ORDER — SODIUM CHLORIDE 0.9 % IV SOLN
Freq: Once | INTRAVENOUS | Status: AC
  Administered 2017-02-15: 21:00:00 via INTRAVENOUS

## 2017-02-15 NOTE — Progress Notes (Signed)
[facility]  ROUNDING NOTE   Subjective:  Creatinine down to 1.7 today. Patient resting comfortably in bed. Hemoglobin down to 7.0.   Objective:  Vital signs in last 24 hours:  Temp:  [96.5 F (35.8 C)-97.8 F (36.6 C)] 96.5 F (35.8 C) (04/08 0549) Pulse Rate:  [58-71] 66 (04/08 0811) Resp:  [18-20] 18 (04/08 0811) BP: (98-109)/(47-57) 98/47 (04/08 0811) SpO2:  [97 %-99 %] 97 % (04/08 0811)  Weight change:  Filed Weights   02/12/17 0509 02/12/17 1230  Weight: 119.7 kg (264 lb) 117.5 kg (259 lb 1.6 oz)    Intake/Output: I/O last 3 completed shifts: In: 2765.8 [I.V.:1815.8; IV Piggyback:950] Out: 1401 [Urine:1400; Stool:1]   Intake/Output this shift:  Total I/O In: -  Out: 550 [Urine:550]  Physical Exam: General: disheveled  Head: Normocephalic, atraumatic. Moist oral mucosal membranes  Eyes: Anicteric  Neck: Supple, trachea midline  Lungs:  Bilateral rhonchi, normal effort  Heart: S1S2 no rubs  Abdomen:  Soft, nontender, BS present  Extremities: 1+peripheral edema.  Neurologic: Nonfocal, moving all four extremities  Skin: Scaly skin b/l LE's, hyperemia b/l LE's       Basic Metabolic Panel:  Recent Labs Lab 02/12/17 0515 02/13/17 0520 02/13/17 1302 02/14/17 0627 02/14/17 1127 02/15/17 0616  NA 137 144  --   --  142 143  K 4.0 3.8  --   --  3.9 3.9  CL 103 111  --   --  110 112*  CO2 27 25  --   --  26 26  GLUCOSE 157* 116*  --   --  284* 246*  BUN 78* 73*  --   --  63* 53*  CREATININE 2.98* 2.71*  --  2.06* 2.01* 1.73*  CALCIUM 8.7* 8.7*  --   --  8.5* 8.8*  PHOS  --   --  4.3  --   --   --     Liver Function Tests:  Recent Labs Lab 02/12/17 0515 02/15/17 0616  AST 18 19  ALT 13* 11*  ALKPHOS 116 83  BILITOT 1.3* 0.9  PROT 5.9* 5.6*  ALBUMIN 2.6* 3.0*   No results for input(s): LIPASE, AMYLASE in the last 168 hours.  Recent Labs Lab 02/12/17 0515 02/13/17 0520 02/14/17 1127  AMMONIA 163* 68* 48*     CBC:  Recent Labs Lab 02/12/17 0515 02/14/17 1127 02/15/17 0616  WBC 3.8 3.3* 2.5*  HGB 7.7* 8.3* 7.0*  HCT 24.0* 25.8* 21.9*  MCV 96.2 99.9 100.3*  PLT 60* 69* 52*    Cardiac Enzymes: No results for input(s): CKTOTAL, CKMB, CKMBINDEX, TROPONINI in the last 168 hours.  BNP: Invalid input(s): POCBNP  CBG:  Recent Labs Lab 02/14/17 1203 02/14/17 1655 02/14/17 2006 02/15/17 0743 02/15/17 1138  GLUCAP 289* 327* 320* 230* 35*    Microbiology: Results for orders placed or performed during the hospital encounter of 02/12/17  Blood culture (routine x 2)     Status: Abnormal (Preliminary result)   Collection Time: 02/12/17  5:17 AM  Result Value Ref Range Status   Specimen Description BLOOD R FA  Final   Special Requests   Final    BOTTLES DRAWN AEROBIC AND ANAEROBIC Blood Culture adequate volume   Culture  Setup Time   Final    GRAM POSITIVE COCCI IN BOTH AEROBIC AND ANAEROBIC BOTTLES CRITICAL RESULT CALLED TO, READ BACK BY AND VERIFIED WITH: JASON ROBBINS AT 2037 02/12/2017 BY TFK MATT MCBANE @ 1601 ON 02/13/2017 BY CAF  Culture (A)  Final    STAPHYLOCOCCUS AUREUS GROUP B STREP(S.AGALACTIAE)ISOLATED SUSCEPTIBILITIES TO FOLLOW STAPHYLOCOCCUS EPIDERMIDIS STAPHYLOCOCCUS HAEMOLYTICUS THE SIGNIFICANCE OF ISOLATING THIS ORGANISM FROM A SINGLE SET OF BLOOD CULTURES WHEN MULTIPLE SETS ARE DRAWN IS UNCERTAIN. PLEASE NOTIFY THE MICROBIOLOGY DEPARTMENT WITHIN ONE WEEK IF SPECIATION AND SENSITIVITIES ARE REQUIRED. Performed at [facility], [place] [address], [place], [place] [address]    Report Status PENDING  Incomplete  Blood culture (routine x 2)     Status: Abnormal   Collection Time: 02/12/17  5:17 AM  Result Value Ref Range Status   Specimen Description BLOOD R H  Final   Special Requests   Final    BOTTLES DRAWN AEROBIC AND ANAEROBIC Blood Culture adequate volume   Culture  Setup Time   Final    GRAM POSITIVE COCCI ANAEROBIC BOTTLE ONLY CRITICAL RESULT  CALLED TO, READ BACK BY AND VERIFIED WITH: MATT MCBANE @ 8563 ON 02/13/2017 BY CAF    Culture (A)  Final    STAPHYLOCOCCUS CAPITIS THE SIGNIFICANCE OF ISOLATING THIS ORGANISM FROM A SINGLE SET OF BLOOD CULTURES WHEN MULTIPLE SETS ARE DRAWN IS UNCERTAIN. PLEASE NOTIFY THE MICROBIOLOGY DEPARTMENT WITHIN ONE WEEK IF SPECIATION AND SENSITIVITIES ARE REQUIRED. Performed at [facility], [place] [address], [place], [place] [address]    Report Status 02/15/2017 FINAL  Final  Urine culture     Status: Abnormal   Collection Time: 02/12/17  5:17 AM  Result Value Ref Range Status   Specimen Description URINE, RANDOM  Final   Special Requests NONE  Final   Culture MULTIPLE SPECIES PRESENT, SUGGEST RECOLLECTION (A)  Final   Report Status 02/13/2017 FINAL  Final  Blood Culture ID Panel (Reflexed)     Status: Abnormal   Collection Time: 02/12/17  5:17 AM  Result Value Ref Range Status   Enterococcus species NOT DETECTED NOT DETECTED Final   Listeria monocytogenes NOT DETECTED NOT DETECTED Final   Staphylococcus species DETECTED (A) NOT DETECTED Final    Comment: CRITICAL RESULT CALLED TO, READ BACK BY AND VERIFIED WITH: JASON ROBBINS AT 2037 02/12/2017 BY TFK    Staphylococcus aureus DETECTED (A) NOT DETECTED Final    Comment: Methicillin (oxacillin)-resistant Staphylococcus aureus (MRSA). MRSA is predictably resistant to beta-lactam antibiotics (except ceftaroline). Preferred therapy is vancomycin unless clinically contraindicated. Patient requires contact precautions if  hospitalized. CRITICAL RESULT CALLED TO, READ BACK BY AND VERIFIED WITH: JASON ROBBINS AT 2037 02/12/2017 BY TFK    Methicillin resistance DETECTED (A) NOT DETECTED Final    Comment: CRITICAL RESULT CALLED TO, READ BACK BY AND VERIFIED WITH: JASON ROBBINS AT 2037 02/12/2017 BY TFK    Streptococcus species DETECTED (A) NOT DETECTED Final    Comment: CRITICAL RESULT CALLED TO, READ BACK BY AND VERIFIED WITH: JASON ROBBINS AT  2037 02/12/2017 BY TFK    Streptococcus agalactiae DETECTED (A) NOT DETECTED Final    Comment: CRITICAL RESULT CALLED TO, READ BACK BY AND VERIFIED WITH: JASON ROBBINS AT 2037 02/12/2017 BY TFK    Streptococcus pneumoniae NOT DETECTED NOT DETECTED Final   Streptococcus pyogenes NOT DETECTED NOT DETECTED Final   Acinetobacter baumannii NOT DETECTED NOT DETECTED Final   Enterobacteriaceae species NOT DETECTED NOT DETECTED Final   Enterobacter cloacae complex NOT DETECTED NOT DETECTED Final   Escherichia coli NOT DETECTED NOT DETECTED Final   Klebsiella oxytoca NOT DETECTED NOT DETECTED Final   Klebsiella pneumoniae NOT DETECTED NOT DETECTED Final   Proteus species NOT DETECTED NOT DETECTED Final  Serratia marcescens NOT DETECTED NOT DETECTED Final   Haemophilus influenzae NOT DETECTED NOT DETECTED Final   Neisseria meningitidis NOT DETECTED NOT DETECTED Final   Pseudomonas aeruginosa NOT DETECTED NOT DETECTED Final   Candida albicans NOT DETECTED NOT DETECTED Final   Candida glabrata NOT DETECTED NOT DETECTED Final   Candida krusei NOT DETECTED NOT DETECTED Final   Candida parapsilosis NOT DETECTED NOT DETECTED Final   Candida tropicalis NOT DETECTED NOT DETECTED Final  MRSA PCR Screening     Status: None   Collection Time: 02/12/17  3:03 PM  Result Value Ref Range Status   MRSA by PCR NEGATIVE NEGATIVE Final    Comment:        The GeneXpert MRSA Assay (FDA approved for NASAL specimens only), is one component of a comprehensive MRSA colonization surveillance program. It is not intended to diagnose MRSA infection nor to guide or monitor treatment for MRSA infections.   Aerobic/Anaerobic Culture (surgical/deep wound)     Status: None   Collection Time: 02/13/17  3:00 PM  Result Value Ref Range Status   Specimen Description WOUND  Final   Special Requests NO ANAEROBIC SWAB SENT  Final   Culture   Final    SPECIMEN/CONTAINER TYPE INAPPROPRIATE FOR ORDERED TEST, UNABLE TO  PERFORM NOTIFIED RN KRISTA AT 1142 02/15/17. RN TO REORDER AS AEROBIC CULTURE ONLY. Performed at [facility], [address] [place], [place] [address]    Report Status 02/15/2017 FINAL  Final  Aerobic Culture (superficial specimen)     Status: None (Preliminary result)   Collection Time: 02/13/17  3:00 PM  Result Value Ref Range Status   Specimen Description LEG LEFT LOWER  Final   Special Requests SWAB  Final   Gram Stain   Final    FEW WBC PRESENT,BOTH PMN AND MONONUCLEAR NO ORGANISMS SEEN Performed at [facility], [address] [place], [place] [address]    Culture RARE STAPHYLOCOCCUS AUREUS  Final   Report Status PENDING  Incomplete  Culture, blood (single) w Reflex to ID Panel     Status: None (Preliminary result)   Collection Time: 02/13/17  5:28 PM  Result Value Ref Range Status   Specimen Description BLOOD L AC  Final   Special Requests BOTTLES DRAWN AEROBIC AND ANAEROBIC BCHV  Final   Culture NO GROWTH 2 DAYS  Final   Report Status PENDING  Incomplete    Coagulation Studies: No results for input(s): LABPROT, INR in the last 72 hours.  Urinalysis: No results for input(s): COLORURINE, LABSPEC, PHURINE, GLUCOSEU, HGBUR, BILIRUBINUR, KETONESUR, PROTEINUR, UROBILINOGEN, NITRITE, LEUKOCYTESUR in the last 72 hours.  Invalid input(s): APPERANCEUR    Imaging: US Renal  Result Date: 02/13/2017 CLINICAL DATA:  Acute renal failure. Ascites, status post paracentesis today. EXAM: RENAL / URINARY TRACT ULTRASOUND COMPLETE COMPARISON:  None. FINDINGS: Right Kidney: Length: 12.1 cm. Mildly increased cortical echogenicity. No mass or hydronephrosis visualized. Left Kidney: Length: 10.2 Cm. Mildly increased cortical echogenicity. No mass or hydronephrosis visualized. Bladder: Appears normal for degree of bladder distention. IMPRESSION: Echogenic kidneys compatible with medical renal disease Electronically Signed   By: Awilda Metro M.D.   On: 02/13/2017 15:41      Medications:   . sodium chloride 50 mL/hr at 02/14/17 1800   . sodium chloride   Intravenous Once  . ammonium lactate   Topical BID  . cefTRIAXone (ROCEPHIN)  IV  1 g Intravenous Q24H  . ferrous sulfate  325 mg Oral BID  WC  . insulin aspart  0-9 Units Subcutaneous TID WC  . lactulose  10 g Oral BID  . levothyroxine  200 mcg Oral BH-q7a  . multivitamin with minerals  1 tablet Oral Daily  . nystatin   Topical TID  . pantoprazole  40 mg Oral BID  . spironolactone  50 mg Oral Daily  . vancomycin  1,250 mg Intravenous Q18H   haloperidol lactate, lactulose, ondansetron **OR** ondansetron (ZOFRAN) IV  Assessment/ Plan:  66 y.o. male with a PMHx of Diabetes mellitus type 2, cirrhosis, hypothyroidism, ascites, nonalcoholic fatty liver disease, iron deficiency anemia, chronic kidney disease stage III, who was admitted to [facility] on 02/12/2017 for evaluation of altered mental status.   1.  Acute renal failure, likely due to poor po intake, sepsis, UTI. 2.  Chronic kidney disease stage III baseline egfr 35. 3.  Sepsis, with MRSA and strep. 4.  Anemia of CKD.  5.  Cirrhosis of the liver.  Plan:  Renal function continues to improve. BUN down to 53 with a creatinine of 1.7. We will maintain the patient on 0.9 normal saline at 50 cc per hour. Continue treatment of sepsis with vancomycin. He also appears to have worsening anemia chronic kidney disease. Hemoglobin down to 7.0. Consider blood transfusion however defer this to hospitalist.   LOS: 3 Roberto Perez 4/8/201812:47 PM

## 2017-02-15 NOTE — Progress Notes (Signed)
[facility] [place] NAME: Roberto Perez    MR#:  [id number]  DATE OF BIRTH:  1951/04/25  SUBJECTIVE:  CHIEF COMPLAINT:   Chief Complaint  Patient presents with  . Anemia   -  Doing well, right leg swelling - abdominal distention worsening again - hb down to 7 today  REVIEW OF SYSTEMS:  Review of Systems  Constitutional: Positive for malaise/fatigue. Negative for chills and fever.  HENT: Negative for congestion, ear discharge, hearing loss and nosebleeds.   Eyes: Negative for blurred vision and double vision.  Respiratory: Negative for cough, shortness of breath and wheezing.   Cardiovascular: Positive for leg swelling. Negative for chest pain and palpitations.  Gastrointestinal: Positive for diarrhea. Negative for abdominal pain, constipation, nausea and vomiting.  Genitourinary: Negative for dysuria.  Neurological: Positive for tremors. Negative for dizziness, seizures and headaches.    DRUG ALLERGIES:  No Known Allergies  VITALS:  Blood pressure (!) 98/47, pulse 66, temperature (!) 96.5 F (35.8 C), temperature source Axillary, resp. rate 18, height 6\' 2"  (1.88 m), weight 117.5 kg (259 lb 1.6 oz), SpO2 97 %.  PHYSICAL EXAMINATION:  Physical Exam  GENERAL:  66 y.o.-year-old patient lying in the bed with no acute distress.  EYES: Pupils equal, round, reactive to light and accommodation. No scleral icterus. Extraocular muscles intact.  HEENT: Head atraumatic, normocephalic. Oropharynx and nasopharynx clear.  NECK:  Supple, no jugular venous distention. No thyroid enlargement, no tenderness.  LUNGS: Normal breath sounds bilaterally, no wheezing, rales,rhonchi or crepitation. No use of accessory muscles of respiration.  CARDIOVASCULAR: S1, S2 normal. No murmurs, rubs, or gallops.  ABDOMEN: Soft, distended, nontender. Bowel sounds present. No organomegaly or mass.  EXTREMITIES: No cyanosis, or clubbing. 1+ pedal edema, positive  for mild asterexis NEUROLOGIC: Cranial nerves II through XII are intact. Muscle strength 5/5 in all extremities. Sensation intact. Gait not checked. Global weakness noted. PSYCHIATRIC: The patient is alert and oriented x 2.  SKIN: No obvious rash, lesion, or ulcer.    LABORATORY PANEL:   CBC  Recent Labs Lab 02/15/17 0616  WBC 2.5*  HGB 7.0*  HCT 21.9*  PLT 52*   ------------------------------------------------------------------------------------------------------------------  Chemistries   Recent Labs Lab 02/15/17 0616  NA 143  K 3.9  CL 112*  CO2 26  GLUCOSE 246*  BUN 53*  CREATININE 1.73*  CALCIUM 8.8*  AST 19  ALT 11*  ALKPHOS 83  BILITOT 0.9   ------------------------------------------------------------------------------------------------------------------  Cardiac Enzymes No results for input(s): TROPONINI in the last 168 hours. ------------------------------------------------------------------------------------------------------------------  RADIOLOGY:  US Renal  Result Date: 02/13/2017 CLINICAL DATA:  Acute renal failure. Ascites, status post paracentesis today. EXAM: RENAL / URINARY TRACT ULTRASOUND COMPLETE COMPARISON:  None. FINDINGS: Right Kidney: Length: 12.1 cm. Mildly increased cortical echogenicity. No mass or hydronephrosis visualized. Left Kidney: Length: 10.2 Cm. Mildly increased cortical echogenicity. No mass or hydronephrosis visualized. Bladder: Appears normal for degree of bladder distention. IMPRESSION: Echogenic kidneys compatible with medical renal disease Electronically Signed   By: Elon Alas M.D.   On: 02/13/2017 15:41    EKG:   Orders placed or performed during the hospital encounter of 02/12/17  . EKG 12-Lead  . EKG 12-Lead    ASSESSMENT AND PLAN:   MelvinElliottis a 66 y.o.malewith a known history of Severe iron deficiency anemia which is chronic has had several blood transfusions in the recent past most recent  admission at [facility] from March 28 to April 3 for severe  anemia where he ended up getting 7 units of blood transfusion. Patient has history of CK D stage III creatinine around 1.9, type 2 diabetes, nonalcoholic fatty liver disease/cirrhosis with portal hypertension and ascites and pancytopenia comes to the emergency room from [place] with altered mental status.  1. Altered mental status/acute encephalopathy appears metabolic/hepatic/Sepsis -Ammonia of 163 on admission - on lactulose -improving mentation  2. Liver cirrhosis with ascites- -Patient has history of nonalcoholic fatty liver disease/cirrhosis of liver with complicatedascites, coagulopathy, portal hypertension, esophageal varices, pancytopenia -s/p Paracentesis ultrasound-guided therapeutic---fluid positive for 2900 WBC c/w SBP (already on IV Rocephin) -IV albumin started -GI consultation appreciated - ascites to be tapped again tomorrow  3. Severe iron deficiency anemia with significant chronic blood loss anemia -Patient's baseline hemoglobin is  At 7 -He recently had 7 units of blood transfusion last week at [facility] -Extensive workup has been done at [facility] in the form of EGD and colonoscopy which showed no variances however has history of varices status post banding in the past -Capsule endoscopy showed multiple mucosal defects throughout the entire bowel -We'll continue Protonix daily. -Continue oral iron - 1 unit PRBC ordered today for hb of 7 especially with renal failure  4. Acute on chronic renal failure -Multi-factorial appears hepatorenal syndrome with poor intravascular volume along with history of diabetes -Nephrology consultation noted.  -Baseline creatinine 1.7-1.9. IV albumin ordered yesterday and 3 more doses tomorrow - lasix and aldactone once renal function improves  5. MRSA/Strep Sepsis with UTI -IV Rocephin and IV vancomycin Follow-up urine cultures shows multiple species -ID consulted. ECHO with no  vegetations. Repeat blood cultures ordered and negative so far -might need PICC and long term IV ABX  6. DVT prophylaxis SCD teds Patient has chronic thrombocytopenia. Platelet Count 60,000 we'll avoid a antiplatelet agents.  7. Type 2 diabetes -Sliding-scale insulin  8. Patient carries a very poor prognosis given his multitude of medical problems . Palliative care consultation.pt is now DNR     All the records are reviewed and case discussed with Care Management/Social Workerr. Management plans discussed with the patient, family and they are in agreement.  CODE STATUS: Full Code  TOTAL TIME TAKING CARE OF THIS PATIENT: 32 minutes.   POSSIBLE D/C IN 2-3 DAYS, DEPENDING ON CLINICAL CONDITION.   Gladstone Lighter M.D on 02/15/2017 at 1:03 PM  Between 7am to 6pm - Pager - [phone]  After 6pm go to [email or web address] - password [email or web address] [facility]  Office  [phone]  CC: Primary care physician; Gayland Curry, MD

## 2017-02-15 NOTE — Progress Notes (Signed)
Lab called to advise that the type and cross results are delayed due to the pt having a positive antibody

## 2017-02-15 NOTE — Progress Notes (Signed)
ANTIBIOTIC CONSULT NOTE  Pharmacy Consult for Vancomycin Indication: bacteremia  No Known Allergies  Patient Measurements: Height: 6\' 2"  (188 cm) Weight: 259 lb 1.6 oz (117.5 kg) IBW/kg (Calculated) : 82.2 Adjusted Body Weight: 96.32 kg   Vital Signs: Temp: 96.5 F (35.8 C) (04/08 0549) Temp Source: Axillary (04/08 0549) BP: 98/47 (04/08 0811) Pulse Rate: 66 (04/08 0811) Intake/Output from previous day: 04/07 0701 - 04/08 0700 In: 1872.5 [I.V.:1172.5; IV Piggyback:700] Out: 700 [Urine:700] Intake/Output from this shift: Total I/O In: -  Out: 550 [Urine:550]  Labs:  Recent Labs  02/13/17 1752 02/14/17 0627 02/14/17 1127 02/15/17 0616  WBC  --   --  3.3* 2.5*  HGB  --   --  8.3* 7.0*  PLT  --   --  69* 52*  LABCREA 77  --   --   --   CREATININE  --  2.06* 2.01* 1.73*   Estimated Creatinine Clearance: 57.2 mL/min (A) (by C-G formula based on SCr of 1.73 mg/dL (H)).  Recent Labs  02/15/17 0954  [facility] 40     Microbiology: Recent Results (from the past 720 hour(s))  Blood culture (routine x 2)     Status: Abnormal (Preliminary result)   Collection Time: 02/12/17  5:17 AM  Result Value Ref Range Status   Specimen Description BLOOD R FA  Final   Special Requests   Final    BOTTLES DRAWN AEROBIC AND ANAEROBIC Blood Culture adequate volume   Culture  Setup Time   Final    GRAM POSITIVE COCCI IN BOTH AEROBIC AND ANAEROBIC BOTTLES CRITICAL RESULT CALLED TO, READ BACK BY AND VERIFIED WITH: JASON ROBBINS AT 2037 02/12/2017 BY TFK MATT MCBANE @ 1610 ON 02/13/2017 BY CAF    Culture (A)  Final    STAPHYLOCOCCUS AUREUS GROUP B STREP(S.AGALACTIAE)ISOLATED SUSCEPTIBILITIES TO FOLLOW STAPHYLOCOCCUS EPIDERMIDIS STAPHYLOCOCCUS HAEMOLYTICUS THE SIGNIFICANCE OF ISOLATING THIS ORGANISM FROM A SINGLE SET OF BLOOD CULTURES WHEN MULTIPLE SETS ARE DRAWN IS UNCERTAIN. PLEASE NOTIFY THE MICROBIOLOGY DEPARTMENT WITHIN ONE WEEK IF SPECIATION AND SENSITIVITIES ARE  REQUIRED. Performed at [facility], [place] [address] [place] , [place] [address]    Report Status PENDING  Incomplete  Blood culture (routine x 2)     Status: Abnormal   Collection Time: 02/12/17  5:17 AM  Result Value Ref Range Status   Specimen Description BLOOD R H  Final   Special Requests   Final    BOTTLES DRAWN AEROBIC AND ANAEROBIC Blood Culture adequate volume   Culture  Setup Time   Final    GRAM POSITIVE COCCI ANAEROBIC BOTTLE ONLY CRITICAL RESULT CALLED TO, READ BACK BY AND VERIFIED WITH: MATT MCBANE @ 4098 ON 02/13/2017 BY CAF    Culture (A)  Final    STAPHYLOCOCCUS CAPITIS THE SIGNIFICANCE OF ISOLATING THIS ORGANISM FROM A SINGLE SET OF BLOOD CULTURES WHEN MULTIPLE SETS ARE DRAWN IS UNCERTAIN. PLEASE NOTIFY THE MICROBIOLOGY DEPARTMENT WITHIN ONE WEEK IF SPECIATION AND SENSITIVITIES ARE REQUIRED. Performed at [facility], [place] [address] [place], [place] [address]    Report Status 02/15/2017 FINAL  Final  Urine culture     Status: Abnormal   Collection Time: 02/12/17  5:17 AM  Result Value Ref Range Status   Specimen Description URINE, RANDOM  Final   Special Requests NONE  Final   Culture MULTIPLE SPECIES PRESENT, SUGGEST RECOLLECTION (A)  Final   Report Status 02/13/2017 FINAL  Final  Blood Culture ID Panel (Reflexed)  Status: Abnormal   Collection Time: 02/12/17  5:17 AM  Result Value Ref Range Status   Enterococcus species NOT DETECTED NOT DETECTED Final   Listeria monocytogenes NOT DETECTED NOT DETECTED Final   Staphylococcus species DETECTED (A) NOT DETECTED Final    Comment: CRITICAL RESULT CALLED TO, READ BACK BY AND VERIFIED WITH: JASON ROBBINS AT 2037 02/12/2017 BY TFK    Staphylococcus aureus DETECTED (A) NOT DETECTED Final    Comment: Methicillin (oxacillin)-resistant Staphylococcus aureus (MRSA). MRSA is predictably resistant to beta-lactam antibiotics (except ceftaroline). Preferred therapy is vancomycin unless clinically contraindicated.  Patient requires contact precautions if  hospitalized. CRITICAL RESULT CALLED TO, READ BACK BY AND VERIFIED WITH: JASON ROBBINS AT 2037 02/12/2017 BY TFK    Methicillin resistance DETECTED (A) NOT DETECTED Final    Comment: CRITICAL RESULT CALLED TO, READ BACK BY AND VERIFIED WITH: JASON ROBBINS AT 2037 02/12/2017 BY TFK    Streptococcus species DETECTED (A) NOT DETECTED Final    Comment: CRITICAL RESULT CALLED TO, READ BACK BY AND VERIFIED WITH: JASON ROBBINS AT 2037 02/12/2017 BY TFK    Streptococcus agalactiae DETECTED (A) NOT DETECTED Final    Comment: CRITICAL RESULT CALLED TO, READ BACK BY AND VERIFIED WITH: JASON ROBBINS AT 2037 02/12/2017 BY TFK    Streptococcus pneumoniae NOT DETECTED NOT DETECTED Final   Streptococcus pyogenes NOT DETECTED NOT DETECTED Final   Acinetobacter baumannii NOT DETECTED NOT DETECTED Final   Enterobacteriaceae species NOT DETECTED NOT DETECTED Final   Enterobacter cloacae complex NOT DETECTED NOT DETECTED Final   Escherichia coli NOT DETECTED NOT DETECTED Final   Klebsiella oxytoca NOT DETECTED NOT DETECTED Final   Klebsiella pneumoniae NOT DETECTED NOT DETECTED Final   Proteus species NOT DETECTED NOT DETECTED Final   Serratia marcescens NOT DETECTED NOT DETECTED Final   Haemophilus influenzae NOT DETECTED NOT DETECTED Final   Neisseria meningitidis NOT DETECTED NOT DETECTED Final   Pseudomonas aeruginosa NOT DETECTED NOT DETECTED Final   Candida albicans NOT DETECTED NOT DETECTED Final   Candida glabrata NOT DETECTED NOT DETECTED Final   Candida krusei NOT DETECTED NOT DETECTED Final   Candida parapsilosis NOT DETECTED NOT DETECTED Final   Candida tropicalis NOT DETECTED NOT DETECTED Final  MRSA PCR Screening     Status: None   Collection Time: 02/12/17  3:03 PM  Result Value Ref Range Status   MRSA by PCR NEGATIVE NEGATIVE Final    Comment:        The GeneXpert MRSA Assay (FDA approved for NASAL specimens only), is one component of  a comprehensive MRSA colonization surveillance program. It is not intended to diagnose MRSA infection nor to guide or monitor treatment for MRSA infections.   Aerobic/Anaerobic Culture (surgical/deep wound)     Status: None   Collection Time: 02/13/17  3:00 PM  Result Value Ref Range Status   Specimen Description WOUND  Final   Special Requests NO ANAEROBIC SWAB SENT  Final   Culture   Final    SPECIMEN/CONTAINER TYPE INAPPROPRIATE FOR ORDERED TEST, UNABLE TO PERFORM NOTIFIED RN KRISTA AT 1142 02/15/17. RN TO REORDER AS AEROBIC CULTURE ONLY. Performed at [facility], [place] [address], [place],  [address]    Report Status 02/15/2017 FINAL  Final  Aerobic Culture (superficial specimen)     Status: None (Preliminary result)   Collection Time: 02/13/17  3:00 PM  Result Value Ref Range Status   Specimen Description LEG LEFT LOWER  Final   Special Requests SWAB  Final   Gram Stain   Final    FEW WBC PRESENT,BOTH PMN AND MONONUCLEAR NO ORGANISMS SEEN Performed at [facility], [place] [address], [place], [place] [address]    Culture RARE STAPHYLOCOCCUS AUREUS  Final   Report Status PENDING  Incomplete  Culture, blood (single) w Reflex to ID Panel     Status: None (Preliminary result)   Collection Time: 02/13/17  5:28 PM  Result Value Ref Range Status   Specimen Description BLOOD L AC  Final   Special Requests BOTTLES DRAWN AEROBIC AND ANAEROBIC [place]  Final   Culture NO GROWTH 2 DAYS  Final   Report Status PENDING  Incomplete    Medical History: Past Medical History:  Diagnosis Date  . Anemia     Medications:  Prescriptions Prior to Admission  Medication Sig Dispense Refill Last Dose  . allopurinol (ZYLOPRIM) 300 MG tablet Take 300 mg by mouth daily.   02/11/2017 at 0800  . carvedilol (COREG) 3.125 MG tablet Take 3.125 mg by mouth 2 (two) times daily.   02/11/2017 at 2000  . glipiZIDE (GLUCOTROL XL) 10 MG 24 hr tablet Take 10 mg by mouth daily.   02/11/2017 at 0800   . lactulose (CHRONULAC) 10 GM/15ML solution Take 15 mLs by mouth 3 (three) times daily.   02/11/2017 at 2000  . LEVEMIR 100 UNIT/ML injection Inject 20 Units into the skin daily.   02/11/2017 at 0800  . levothyroxine (SYNTHROID, LEVOTHROID) 200 MCG tablet Take 200 mcg by mouth daily.   02/12/2017 at 0600  . pantoprazole (PROTONIX) 40 MG tablet Take 40 mg by mouth 2 (two) times daily.   02/11/2017 at 2000  . spironolactone (ALDACTONE) 100 MG tablet Take 50 mg by mouth daily.   02/11/2017 at 0800   Assessment: Pharmacy consulted to dose vancomycin in this 66 year old male with blood Cx growing Strep agalactiae and Staph A (Mech A +) per BCID ([facility] in 2/2). Patient is also on ceftriaxone for UTI and possible SBP.   Goal of Therapy:  Vancomycin trough level 15-20 mcg/ml  Plan:  Expected duration 7 days with resolution of temperature and/or normalization of WBC  Will continue vancomycin 1250 mg iv q 24 hours with planned trough 4/9. SCr ordered 4/7. Will continue to follow and adjust dosing/order levels as clinically indicated.   ADD: Changed Vancomycin to 1250 mg IV q18 hours. Scr ordered with am labs. Likely will need to change regimen again if patient's renal function continues.   4/8: Vancomycin Random ~ 12 hours post dose was 40 despite improvement in renal function. Will recheck level @ 10:00 on 4/9 (~36 hours post dose).   Ivyana Locey D 02/15/2017,1:51 PM

## 2017-02-15 NOTE — Progress Notes (Signed)
Roberto Perez is being followed for SBP Day 3 of follow up   Subjective: Patient complains of leg pain, but attributes it to being in a hospital bed, he denies that it's crampy in nature or like restless leg. It does not wake him up at night.  He also reports rumbling in her stomach anytime he eats He does not recall the number of BMs he had yesterday, but believes he did have BMs   Objective: Vital signs in last 24 hours: Vitals:   02/14/17 2150 02/15/17 0458 02/15/17 0549 02/15/17 0811  BP: (!) 99/52 (!) 105/47  (!) 98/47  Pulse: 61 71  66  Resp:  20  18  Temp:  (!) 96.5 F (35.8 C) (!) 96.5 F (35.8 C)   TempSrc:  Axillary Axillary   SpO2: 99% 97%  97%  Weight:      Height:       Weight change:   Intake/Output Summary (Last 24 hours) at 02/15/17 1107 Last data filed at 02/15/17 1024  Gross per 24 hour  Intake           1822.5 ml  Output             1150 ml  Net            672.5 ml     Exam: Gen: older white male laying comfortably in a hospital bed in NAD HEENT: sclera anicteric, OP clear with MM that are less dry than yesterday, but remain on the drier end Heart:: RRR Lungs: no respiratory distress Abdomen: +BS, soft, NT, distended with ascites fluid Ext: warm without edema Neuro: alert, responding to questions appropriately, + 1 beat asterixis  Lab Results: @LABTEST2 @ Micro Results: Recent Results (from the past 240 hour(s))  Blood culture (routine x 2)     Status: Abnormal (Preliminary result)   Collection Time: 02/12/17  5:17 AM  Result Value Ref Range Status   Specimen Description BLOOD R FA  Final   Special Requests   Final    BOTTLES DRAWN AEROBIC AND ANAEROBIC Blood Culture adequate volume   Culture  Setup Time   Final    GRAM POSITIVE COCCI IN BOTH AEROBIC AND ANAEROBIC BOTTLES CRITICAL RESULT CALLED TO, READ BACK BY AND VERIFIED WITH: JASON ROBBINS AT 2037 02/12/2017 BY TFK MATT MCBANE @ 1572 ON 02/13/2017 BY CAF    Culture (A)  Final   STAPHYLOCOCCUS AUREUS GROUP B STREP(S.AGALACTIAE)ISOLATED SUSCEPTIBILITIES TO FOLLOW STAPHYLOCOCCUS EPIDERMIDIS STAPHYLOCOCCUS HAEMOLYTICUS THE SIGNIFICANCE OF ISOLATING THIS ORGANISM FROM A SINGLE SET OF BLOOD CULTURES WHEN MULTIPLE SETS ARE DRAWN IS UNCERTAIN. PLEASE NOTIFY THE MICROBIOLOGY DEPARTMENT WITHIN ONE WEEK IF SPECIATION AND SENSITIVITIES ARE REQUIRED. Performed at [facility], [place] [address], [place],  [address]    Report Status PENDING  Incomplete  Blood culture (routine x 2)     Status: Abnormal   Collection Time: 02/12/17  5:17 AM  Result Value Ref Range Status   Specimen Description BLOOD R H  Final   Special Requests   Final    BOTTLES DRAWN AEROBIC AND ANAEROBIC Blood Culture adequate volume   Culture  Setup Time   Final    GRAM POSITIVE COCCI ANAEROBIC BOTTLE ONLY CRITICAL RESULT CALLED TO, READ BACK BY AND VERIFIED WITH: MATT MCBANE @ 5974 ON 02/13/2017 BY CAF    Culture (A)  Final    STAPHYLOCOCCUS CAPITIS THE SIGNIFICANCE OF ISOLATING THIS ORGANISM FROM A SINGLE SET OF BLOOD CULTURES WHEN MULTIPLE SETS ARE DRAWN  IS UNCERTAIN. PLEASE NOTIFY THE MICROBIOLOGY DEPARTMENT WITHIN ONE WEEK IF SPECIATION AND SENSITIVITIES ARE REQUIRED. Performed at [facility], [place] [address] [place], [place] [address]    Report Status 02/15/2017 FINAL  Final  Urine culture     Status: Abnormal   Collection Time: 02/12/17  5:17 AM  Result Value Ref Range Status   Specimen Description URINE, RANDOM  Final   Special Requests NONE  Final   Culture MULTIPLE SPECIES PRESENT, SUGGEST RECOLLECTION (A)  Final   Report Status 02/13/2017 FINAL  Final  Blood Culture ID Panel (Reflexed)     Status: Abnormal   Collection Time: 02/12/17  5:17 AM  Result Value Ref Range Status   Enterococcus species NOT DETECTED NOT DETECTED Final   Listeria monocytogenes NOT DETECTED NOT DETECTED Final   Staphylococcus species DETECTED (A) NOT DETECTED Final    Comment: CRITICAL RESULT  CALLED TO, READ BACK BY AND VERIFIED WITH: JASON ROBBINS AT 2037 02/12/2017 BY TFK    Staphylococcus aureus DETECTED (A) NOT DETECTED Final    Comment: Methicillin (oxacillin)-resistant Staphylococcus aureus (MRSA). MRSA is predictably resistant to beta-lactam antibiotics (except ceftaroline). Preferred therapy is vancomycin unless clinically contraindicated. Patient requires contact precautions if  hospitalized. CRITICAL RESULT CALLED TO, READ BACK BY AND VERIFIED WITH: JASON ROBBINS AT 2037 02/12/2017 BY TFK    Methicillin resistance DETECTED (A) NOT DETECTED Final    Comment: CRITICAL RESULT CALLED TO, READ BACK BY AND VERIFIED WITH: JASON ROBBINS AT 2037 02/12/2017 BY TFK    Streptococcus species DETECTED (A) NOT DETECTED Final    Comment: CRITICAL RESULT CALLED TO, READ BACK BY AND VERIFIED WITH: JASON ROBBINS AT 2037 02/12/2017 BY TFK    Streptococcus agalactiae DETECTED (A) NOT DETECTED Final    Comment: CRITICAL RESULT CALLED TO, READ BACK BY AND VERIFIED WITH: JASON ROBBINS AT 2037 02/12/2017 BY TFK    Streptococcus pneumoniae NOT DETECTED NOT DETECTED Final   Streptococcus pyogenes NOT DETECTED NOT DETECTED Final   Acinetobacter baumannii NOT DETECTED NOT DETECTED Final   Enterobacteriaceae species NOT DETECTED NOT DETECTED Final   Enterobacter cloacae complex NOT DETECTED NOT DETECTED Final   Escherichia coli NOT DETECTED NOT DETECTED Final   Klebsiella oxytoca NOT DETECTED NOT DETECTED Final   Klebsiella pneumoniae NOT DETECTED NOT DETECTED Final   Proteus species NOT DETECTED NOT DETECTED Final   Serratia marcescens NOT DETECTED NOT DETECTED Final   Haemophilus influenzae NOT DETECTED NOT DETECTED Final   Neisseria meningitidis NOT DETECTED NOT DETECTED Final   Pseudomonas aeruginosa NOT DETECTED NOT DETECTED Final   Candida albicans NOT DETECTED NOT DETECTED Final   Candida glabrata NOT DETECTED NOT DETECTED Final   Candida krusei NOT DETECTED NOT DETECTED Final    Candida parapsilosis NOT DETECTED NOT DETECTED Final   Candida tropicalis NOT DETECTED NOT DETECTED Final  MRSA PCR Screening     Status: None   Collection Time: 02/12/17  3:03 PM  Result Value Ref Range Status   MRSA by PCR NEGATIVE NEGATIVE Final    Comment:        The GeneXpert MRSA Assay (FDA approved for NASAL specimens only), is one component of a comprehensive MRSA colonization surveillance program. It is not intended to diagnose MRSA infection nor to guide or monitor treatment for MRSA infections.   Aerobic/Anaerobic Culture (surgical/deep wound)     Status: None (Preliminary result)   Collection Time: 02/13/17  3:00 PM  Result Value Ref Range Status   Specimen Description WOUND  Final   Special Requests NO ANAEROBIC SWAB SENT  Final   Gram Stain   Final    FEW WBC PRESENT,BOTH PMN AND MONONUCLEAR NO ORGANISMS SEEN    Culture   Final    RARE STAPHYLOCOCCUS AUREUS SUSCEPTIBILITIES TO FOLLOW Performed at [facility], [address] [place], [place] [address]    Report Status PENDING  Incomplete  Culture, blood (single) w Reflex to ID Panel     Status: None (Preliminary result)   Collection Time: 02/13/17  5:28 PM  Result Value Ref Range Status   Specimen Description BLOOD L AC  Final   Special Requests BOTTLES DRAWN AEROBIC AND ANAEROBIC [place]  Final   Culture NO GROWTH 2 DAYS  Final   Report Status PENDING  Incomplete   Studies/Results: US Renal  Result Date: 02/13/2017 CLINICAL DATA:  Acute renal failure. Ascites, status post paracentesis today. EXAM: RENAL / URINARY TRACT ULTRASOUND COMPLETE COMPARISON:  None. FINDINGS: Right Kidney: Length: 12.1 cm. Mildly increased cortical echogenicity. No mass or hydronephrosis visualized. Left Kidney: Length: 10.2 Cm. Mildly increased cortical echogenicity. No mass or hydronephrosis visualized. Bladder: Appears normal for degree of bladder distention. IMPRESSION: Echogenic kidneys compatible with medical renal disease  Electronically Signed   By: Elon Alas M.D.   On: 02/13/2017 15:41   US Paracentesis  Result Date: 02/13/2017 INDICATION: Ascites. EXAM: ULTRASOUND GUIDED therapeutic and diagnostic PARACENTESIS MEDICATIONS: None. COMPLICATIONS: None immediate. PROCEDURE: Informed written consent was obtained from the patient after a discussion of the risks, benefits and alternatives to treatment. A timeout was performed prior to the initiation of the procedure. Initial ultrasound scanning demonstrates a large amount of ascites within the right lower abdominal quadrant. The right lower abdomen was prepped and draped in the usual sterile fashion. 1% lidocaine with epinephrine was used for local anesthesia. Following this, a Safe-T-Centesis catheter was introduced. An ultrasound image was saved for documentation purposes. The paracentesis was performed. The catheter was removed and a dressing was applied. The patient tolerated the procedure well without immediate post procedural complication. FINDINGS: A total of approximately 3 L of serous fluid was removed. Samples were sent to the laboratory as requested by the clinical team. IMPRESSION: Successful ultrasound-guided paracentesis yielding 3 liters of peritoneal fluid. Electronically Signed   By: Marijo Conception, M.D.   On: 02/13/2017 11:49   Medications: I have reviewed the patient's current medications. Scheduled Meds: . sodium chloride   Intravenous Once  . ammonium lactate   Topical BID  . cefTRIAXone (ROCEPHIN)  IV  1 g Intravenous Q24H  . ferrous sulfate  325 mg Oral BID WC  . insulin aspart  0-9 Units Subcutaneous TID WC  . lactulose  10 g Oral BID  . levothyroxine  200 mcg Oral BH-q7a  . multivitamin with minerals  1 tablet Oral Daily  . nystatin   Topical TID  . pantoprazole  40 mg Oral BID  . spironolactone  50 mg Oral Daily  . vancomycin  1,250 mg Intravenous Q18H   Continuous Infusions: . 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 He is on treatment with ceftriaxone for SBP and UTI and Vancomycin for MRSA bacteremia. His renal function improved dramatically after albumin administration yesterday. Mental status appears stable from yesterday. The patient remains clinically dehydrated both on exam and given his sodium. While his pancytopenia can certainly be attributed to liver disease, his leukopenia appears new and should be evaluated, especially since he started antibiotics.  Plan:  - work-up leukopenia - stop continuous IVF as boluses are more effective for rehydration, LR 1L bolus today - ensure Day 3 albumin tomorrow: albumin 25% 25g IV q8h x3 - titrate lactulose to 3 soft BMs daily, he is starting to have asterixis, which is an indication that he may need more lactulose - high protein diet with 2g sodium restriction  - continue antibiotics, patient should be on life long suppressive antibiotics for SBP - monitor CBC and CMP daily or qod at least   LOS: 3 days   Remer Couse 02/15/2017, 11:07 AM

## 2017-02-16 ENCOUNTER — Inpatient Hospital Stay: Payer: PPO

## 2017-02-16 LAB — AEROBIC CULTURE W GRAM STAIN (SUPERFICIAL SPECIMEN)

## 2017-02-16 LAB — GLUCOSE, CAPILLARY
GLUCOSE-CAPILLARY: 293 mg/dL — AB (ref 65–99)
GLUCOSE-CAPILLARY: 325 mg/dL — AB (ref 65–99)
Glucose-Capillary: 322 mg/dL — ABNORMAL HIGH (ref 65–99)
Glucose-Capillary: 271 mg/dL — ABNORMAL HIGH (ref 65–99)

## 2017-02-16 LAB — CBC
HEMATOCRIT: 24.8 % — AB (ref 40.0–52.0)
HEMOGLOBIN: 8 g/dL — AB (ref 13.0–18.0)
MCH: 32.1 pg (ref 26.0–34.0)
MCHC: 32.1 g/dL (ref 32.0–36.0)
MCV: 100.1 fL — AB (ref 80.0–100.0)
Platelets: 49 10*3/uL — ABNORMAL LOW (ref 150–440)
RBC: 2.48 MIL/uL — ABNORMAL LOW (ref 4.40–5.90)
RDW: 24.7 % — AB (ref 11.5–14.5)
WBC: 3.2 10*3/uL — AB (ref 3.8–10.6)

## 2017-02-16 LAB — AEROBIC CULTURE  (SUPERFICIAL SPECIMEN)

## 2017-02-16 LAB — BASIC METABOLIC PANEL
Anion gap: 3 — ABNORMAL LOW (ref 5–15)
BUN: 46 mg/dL — AB (ref 6–20)
CHLORIDE: 115 mmol/L — AB (ref 101–111)
CO2: 26 mmol/L (ref 22–32)
Calcium: 9 mg/dL (ref 8.9–10.3)
Creatinine, Ser: 1.49 mg/dL — ABNORMAL HIGH (ref 0.61–1.24)
GFR calc Af Amer: 55 mL/min — ABNORMAL LOW (ref >60)
GFR calc non Af Amer: 47 mL/min — ABNORMAL LOW (ref >60)
Glucose, Bld: 296 mg/dL — ABNORMAL HIGH (ref 65–99)
POTASSIUM: 4.4 mmol/L (ref 3.5–5.1)
SODIUM: 144 mmol/L (ref 135–145)

## 2017-02-16 LAB — PROTEIN ELECTROPHORESIS, SERUM
A/G Ratio: 0.9 (ref 0.7–1.7)
ALBUMIN ELP: 2.5 g/dL — AB (ref 2.9–4.4)
Alpha-1-Globulin: 0.3 g/dL (ref 0.0–0.4)
Alpha-2-Globulin: 0.4 g/dL (ref 0.4–1.0)
BETA GLOBULIN: 0.7 g/dL (ref 0.7–1.3)
GAMMA GLOBULIN: 1.4 g/dL (ref 0.4–1.8)
Globulin, Total: 2.7 g/dL (ref 2.2–3.9)
Total Protein ELP: 5.2 g/dL — ABNORMAL LOW (ref 6.0–8.5)

## 2017-02-16 LAB — GLOMERULAR BASEMENT MEMBRANE ANTIBODIES: GBM AB: 14 U (ref 0–20)

## 2017-02-16 LAB — AMMONIA: Ammonia: 60 umol/L — ABNORMAL HIGH (ref 9–35)

## 2017-02-16 LAB — VANCOMYCIN, RANDOM: VANCOMYCIN RM: 24

## 2017-02-16 MED ORDER — INSULIN ASPART 100 UNIT/ML ~~LOC~~ SOLN
0.0000 [IU] | Freq: Every day | SUBCUTANEOUS | Status: DC
  Administered 2017-02-16: 21:00:00 4 [IU] via SUBCUTANEOUS
  Filled 2017-02-16: qty 4

## 2017-02-16 MED ORDER — INSULIN GLARGINE 100 UNIT/ML ~~LOC~~ SOLN
15.0000 [IU] | Freq: Every day | SUBCUTANEOUS | Status: DC
Start: 2017-02-16 — End: 2017-02-17
  Administered 2017-02-16: 15 [IU] via SUBCUTANEOUS
  Filled 2017-02-16 (×2): qty 0.15

## 2017-02-16 MED ORDER — INSULIN ASPART 100 UNIT/ML ~~LOC~~ SOLN
0.0000 [IU] | Freq: Three times a day (TID) | SUBCUTANEOUS | Status: DC
  Administered 2017-02-16: 11 [IU] via SUBCUTANEOUS
  Administered 2017-02-17: 09:00:00 7 [IU] via SUBCUTANEOUS
  Filled 2017-02-16: qty 11
  Filled 2017-02-16: qty 7

## 2017-02-16 MED ORDER — ALBUMIN HUMAN 25 % IV SOLN
25.0000 g | Freq: Three times a day (TID) | INTRAVENOUS | Status: AC
  Administered 2017-02-16 – 2017-02-17 (×3): 25 g via INTRAVENOUS
  Filled 2017-02-16 (×3): qty 100

## 2017-02-16 MED ORDER — SODIUM CHLORIDE 0.9 % IV SOLN
1250.0000 mg | INTRAVENOUS | Status: DC
  Filled 2017-02-16: qty 1250

## 2017-02-16 NOTE — Care Management Important Message (Signed)
Important Message  Patient Details  Name: Roberto Perez MRN: [id number] Date of Birth: 1951/02/03   Medicare Important Message Given:  Yes    Shelbie Ammons, RN 02/16/2017, 11:36 AM

## 2017-02-16 NOTE — Progress Notes (Signed)
Per MD patient will likely be stable for D/C tomorrow. Plan is for patient to return to [facility]. Per [facility] admissions coordinator at [facility] patient is there for short term rehab and can return once a new [facility] SNF authorization is received. [facility] case manager aware of above. Clinical Social Worker (CSW) will continue to follow and assist as needed.   [employer], LCSW [phone]

## 2017-02-16 NOTE — Progress Notes (Signed)
[facility]  ROUNDING NOTE   Subjective:   Did not get paracentesis this morning.   Objective:  Vital signs in last 24 hours:  Temp:  [97.4 F (36.3 C)-98.1 F (36.7 C)] 98.1 F (36.7 C) (04/09 0411) Pulse Rate:  [66-75] 66 (04/09 1141) Resp:  [16-22] 16 (04/09 1141) BP: (121-132)/(52-65) 121/52 (04/09 1141) SpO2:  [96 %-99 %] 99 % (04/09 1141)  Weight change:  Filed Weights   02/12/17 0509 02/12/17 1230  Weight: 119.7 kg (264 lb) 117.5 kg (259 lb 1.6 oz)    Intake/Output: I/O last 3 completed shifts: In: 2679.5 [I.V.:1892.5; Blood:287; IV [id number]:638] Out: 2150 [Urine:2150]   Intake/Output this shift:  Total I/O In: 166.7 [I.V.:116.7; IV Piggyback:50] Out: 250 [Urine:250]  Physical Exam: General: Laying in bed  Head: Normocephalic, atraumatic. Moist oral mucosal membranes  Eyes: Anicteric  Neck: Supple, trachea midline  Lungs:  Bilateral rhonchi, normal effort  Heart: S1S2 no rubs  Abdomen:  +distended  Extremities: 1+ peripheral edema.  Neurologic: Nonfocal, moving all four extremities  Skin: Scaly skin b/l LE's, hyperemia b/l LE's       Basic Metabolic Panel:  Recent Labs Lab 02/12/17 0515 02/13/17 0520 02/13/17 1302 02/14/17 0627 02/14/17 1127 02/15/17 0616 02/16/17 0525  NA 137 144  --   --  142 143 144  K 4.0 3.8  --   --  3.9 3.9 4.4  CL 103 111  --   --  110 112* 115*  CO2 27 25  --   --  26 26 26   GLUCOSE 157* 116*  --   --  284* 246* 296*  BUN 78* 73*  --   --  63* 53* 46*  CREATININE 2.98* 2.71*  --  2.06* 2.01* 1.73* 1.49*  CALCIUM 8.7* 8.7*  --   --  8.5* 8.8* 9.0  PHOS  --   --  4.3  --   --   --   --     Liver Function Tests:  Recent Labs Lab 02/12/17 0515 02/15/17 0616  AST 18 19  ALT 13* 11*  ALKPHOS 116 83  BILITOT 1.3* 0.9  PROT 5.9* 5.6*  ALBUMIN 2.6* 3.0*   No results for input(s): LIPASE, AMYLASE in the last 168 hours.  Recent Labs Lab 02/13/17 0520 02/14/17 1127 02/16/17 0525  AMMONIA 68*  48* 60*    CBC:  Recent Labs Lab 02/12/17 0515 02/14/17 1127 02/15/17 0616 02/16/17 0525  WBC 3.8 3.3* 2.5* 3.2*  HGB 7.7* 8.3* 7.0* 8.0*  HCT 24.0* 25.8* 21.9* 24.8*  MCV 96.2 99.9 100.3* 100.1*  PLT 60* 69* 52* 49*    Cardiac Enzymes: No results for input(s): CKTOTAL, CKMB, CKMBINDEX, TROPONINI in the last 168 hours.  BNP: Invalid input(s): POCBNP  CBG:  Recent Labs Lab 02/15/17 0743 02/15/17 1138 02/15/17 1654 02/15/17 2035 02/16/17 0833  GLUCAP 230* 227* 291* 332* 293*    Microbiology: Results for orders placed or performed during the hospital encounter of 02/12/17  Blood culture (routine x 2)     Status: Abnormal (Preliminary result)   Collection Time: 02/12/17  5:17 AM  Result Value Ref Range Status   Specimen Description BLOOD R FA  Final   Special Requests   Final    BOTTLES DRAWN AEROBIC AND ANAEROBIC Blood Culture adequate volume   Culture  Setup Time   Final    GRAM POSITIVE COCCI IN BOTH AEROBIC AND ANAEROBIC BOTTLES CRITICAL RESULT CALLED TO, READ BACK BY AND VERIFIED WITH:  JASON ROBBINS AT 2037 02/12/2017 BY TFK MATT MCBANE @ 3785 ON 02/13/2017 BY CAF    Culture (A)  Final    STAPHYLOCOCCUS AUREUS GROUP B STREP(S.AGALACTIAE)ISOLATED SUSCEPTIBILITIES TO FOLLOW STAPHYLOCOCCUS EPIDERMIDIS STAPHYLOCOCCUS HAEMOLYTICUS THE SIGNIFICANCE OF ISOLATING THIS ORGANISM FROM A SINGLE SET OF BLOOD CULTURES WHEN MULTIPLE SETS ARE DRAWN IS UNCERTAIN. PLEASE NOTIFY THE MICROBIOLOGY DEPARTMENT WITHIN ONE WEEK IF SPECIATION AND SENSITIVITIES ARE REQUIRED.    Report Status PENDING  Incomplete   Organism ID, Bacteria GROUP B STREP(S.AGALACTIAE)ISOLATED  Final      Susceptibility   Group b strep(s.agalactiae)isolated - MIC*    CLINDAMYCIN <=0.25 SENSITIVE Sensitive     AMPICILLIN <=0.25 SENSITIVE Sensitive     ERYTHROMYCIN <=0.12 SENSITIVE Sensitive     VANCOMYCIN 0.5 SENSITIVE Sensitive     CEFTRIAXONE <=0.12 SENSITIVE Sensitive     LEVOFLOXACIN Value in next  row Sensitive      1 SENSITIVEPerformed at [facility] [address] [place], [place] [address]    * GROUP B STREP(S.AGALACTIAE)ISOLATED  Blood culture (routine x 2)     Status: Abnormal   Collection Time: 02/12/17  5:17 AM  Result Value Ref Range Status   Specimen Description BLOOD R H  Final   Special Requests   Final    BOTTLES DRAWN AEROBIC AND ANAEROBIC Blood Culture adequate volume   Culture  Setup Time   Final    GRAM POSITIVE COCCI ANAEROBIC BOTTLE ONLY CRITICAL RESULT CALLED TO, READ BACK BY AND VERIFIED WITH: MATT MCBANE @ 7741 ON 02/13/2017 BY CAF    Culture (A)  Final    STAPHYLOCOCCUS CAPITIS THE SIGNIFICANCE OF ISOLATING THIS ORGANISM FROM A SINGLE SET OF BLOOD CULTURES WHEN MULTIPLE SETS ARE DRAWN IS UNCERTAIN. PLEASE NOTIFY THE MICROBIOLOGY DEPARTMENT WITHIN ONE WEEK IF SPECIATION AND SENSITIVITIES ARE REQUIRED. Performed at [facility], [place] [address] [place], [place] [address]    Report Status 02/15/2017 FINAL  Final  Urine culture     Status: Abnormal   Collection Time: 02/12/17  5:17 AM  Result Value Ref Range Status   Specimen Description URINE, RANDOM  Final   Special Requests NONE  Final   Culture MULTIPLE SPECIES PRESENT, SUGGEST RECOLLECTION (A)  Final   Report Status 02/13/2017 FINAL  Final  Blood Culture ID Panel (Reflexed)     Status: Abnormal   Collection Time: 02/12/17  5:17 AM  Result Value Ref Range Status   Enterococcus species NOT DETECTED NOT DETECTED Final   Listeria monocytogenes NOT DETECTED NOT DETECTED Final   Staphylococcus species DETECTED (A) NOT DETECTED Final    Comment: CRITICAL RESULT CALLED TO, READ BACK BY AND VERIFIED WITH: JASON ROBBINS AT 2037 02/12/2017 BY TFK    Staphylococcus aureus DETECTED (A) NOT DETECTED Final    Comment: Methicillin (oxacillin)-resistant Staphylococcus aureus (MRSA). MRSA is predictably resistant to beta-lactam antibiotics (except ceftaroline). Preferred therapy is vancomycin unless  clinically contraindicated. Patient requires contact precautions if  hospitalized. CRITICAL RESULT CALLED TO, READ BACK BY AND VERIFIED WITH: JASON ROBBINS AT 2037 02/12/2017 BY TFK    Methicillin resistance DETECTED (A) NOT DETECTED Final    Comment: CRITICAL RESULT CALLED TO, READ BACK BY AND VERIFIED WITH: JASON ROBBINS AT 2037 02/12/2017 BY TFK    Streptococcus species DETECTED (A) NOT DETECTED Final    Comment: CRITICAL RESULT CALLED TO, READ BACK BY AND VERIFIED WITH: JASON ROBBINS AT 2037 02/12/2017 BY TFK    Streptococcus agalactiae DETECTED (A) NOT DETECTED Final  Comment: CRITICAL RESULT CALLED TO, READ BACK BY AND VERIFIED WITH: JASON ROBBINS AT 2037 02/12/2017 BY TFK    Streptococcus pneumoniae NOT DETECTED NOT DETECTED Final   Streptococcus pyogenes NOT DETECTED NOT DETECTED Final   Acinetobacter baumannii NOT DETECTED NOT DETECTED Final   Enterobacteriaceae species NOT DETECTED NOT DETECTED Final   Enterobacter cloacae complex NOT DETECTED NOT DETECTED Final   Escherichia coli NOT DETECTED NOT DETECTED Final   Klebsiella oxytoca NOT DETECTED NOT DETECTED Final   Klebsiella pneumoniae NOT DETECTED NOT DETECTED Final   Proteus species NOT DETECTED NOT DETECTED Final   Serratia marcescens NOT DETECTED NOT DETECTED Final   Haemophilus influenzae NOT DETECTED NOT DETECTED Final   Neisseria meningitidis NOT DETECTED NOT DETECTED Final   Pseudomonas aeruginosa NOT DETECTED NOT DETECTED Final   Candida albicans NOT DETECTED NOT DETECTED Final   Candida glabrata NOT DETECTED NOT DETECTED Final   Candida krusei NOT DETECTED NOT DETECTED Final   Candida parapsilosis NOT DETECTED NOT DETECTED Final   Candida tropicalis NOT DETECTED NOT DETECTED Final  MRSA PCR Screening     Status: None   Collection Time: 02/12/17  3:03 PM  Result Value Ref Range Status   MRSA by PCR NEGATIVE NEGATIVE Final    Comment:        The GeneXpert MRSA Assay (FDA approved for NASAL  specimens only), is one component of a comprehensive MRSA colonization surveillance program. It is not intended to diagnose MRSA infection nor to guide or monitor treatment for MRSA infections.   Aerobic/Anaerobic Culture (surgical/deep wound)     Status: None   Collection Time: 02/13/17  3:00 PM  Result Value Ref Range Status   Specimen Description WOUND  Final   Special Requests NO ANAEROBIC SWAB SENT  Final   Culture   Final    SPECIMEN/CONTAINER TYPE INAPPROPRIATE FOR ORDERED TEST, UNABLE TO PERFORM NOTIFIED RN KRISTA AT 1142 02/15/17. RN TO REORDER AS AEROBIC CULTURE ONLY. Performed at [facility], [place] [address], [place], [place] [address]    Report Status 02/15/2017 FINAL  Final  Aerobic Culture (superficial specimen)     Status: None (Preliminary result)   Collection Time: 02/13/17  3:00 PM  Result Value Ref Range Status   Specimen Description LEG LEFT LOWER  Final   Special Requests SWAB  Final   Gram Stain   Final    FEW WBC PRESENT,BOTH PMN AND MONONUCLEAR NO ORGANISMS SEEN    Culture   Final    RARE METHICILLIN RESISTANT STAPHYLOCOCCUS AUREUS CULTURE REINCUBATED FOR BETTER GROWTH Performed at [facility], [place] [address], [place], [place] [address]    Report Status PENDING  Incomplete   Organism ID, Bacteria METHICILLIN RESISTANT STAPHYLOCOCCUS AUREUS  Final      Susceptibility   Methicillin resistant staphylococcus aureus - MIC*    CIPROFLOXACIN >=8 RESISTANT Resistant     ERYTHROMYCIN >=8 RESISTANT Resistant     GENTAMICIN <=0.5 SENSITIVE Sensitive     OXACILLIN >=4 RESISTANT Resistant     TETRACYCLINE 2 SENSITIVE Sensitive     VANCOMYCIN 1 SENSITIVE Sensitive     TRIMETH/SULFA >=320 RESISTANT Resistant     CLINDAMYCIN >=8 RESISTANT Resistant     RIFAMPIN <=0.5 SENSITIVE Sensitive     Inducible Clindamycin NEGATIVE Sensitive     * RARE METHICILLIN RESISTANT STAPHYLOCOCCUS AUREUS  Culture, blood (single) w Reflex to ID Panel     Status: None  (Preliminary result)   Collection Time: 02/13/17  5:28 PM  Result Value Ref Range Status   Specimen Description BLOOD L AC  Final   Special Requests BOTTLES DRAWN AEROBIC AND ANAEROBIC BCHV  Final   Culture NO GROWTH 3 DAYS  Final   Report Status PENDING  Incomplete    Coagulation Studies: No results for input(s): LABPROT, INR in the last 72 hours.  Urinalysis: No results for input(s): COLORURINE, LABSPEC, PHURINE, GLUCOSEU, HGBUR, BILIRUBINUR, KETONESUR, PROTEINUR, UROBILINOGEN, NITRITE, LEUKOCYTESUR in the last 72 hours.  Invalid input(s): APPERANCEUR    Imaging: No results found.   Medications:    . ammonium lactate   Topical BID  . cefTRIAXone (ROCEPHIN)  IV  1 g Intravenous Q24H  . ferrous sulfate  325 mg Oral BID WC  . insulin aspart  0-9 Units Subcutaneous TID WC  . lactulose  10 g Oral TID  . levothyroxine  200 mcg Oral BH-q7a  . multivitamin with minerals  1 tablet Oral Daily  . nystatin   Topical TID  . pantoprazole  40 mg Oral BID  . spironolactone  50 mg Oral Daily  . vancomycin  1,250 mg Intravenous Q18H   haloperidol lactate, lactulose, ondansetron **OR** ondansetron (ZOFRAN) IV  Assessment/ Plan:  66 y.o.white male with Diabetes mellitus type 2, cirrhosis, hypothyroidism, ascites, nonalcoholic fatty liver disease, iron deficiency anemia, chronic kidney disease stage III, who was admitted to [facility] on 02/12/2017 for evaluation of altered mental status.   1.  Acute renal failure on chronic kidney disease stage III: baseline creatinine of 1.9, GFR of 35 from 02/10/17 Acute renal failure secondary to prerenal azotemia from poor po intake, sepsis, and urinary tract infection - Discontinue IV fluids  2. Diabetes mellitus type II with chronic kidney disease  - continue glucose control.   3.  Sepsis, with MRSA and strep. Spontanous bacterial peritonitis and urinary tract infection - ceftriaxone and vancomycin   4.  Anemia with chronic kidney disease: hemoglobin  8. PRBC transfusion on 4/8  5.  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  - lactulose.    LOS: [place], [place] 4/9/201812:20 PM

## 2017-02-16 NOTE — Progress Notes (Signed)
Inpatient Diabetes Program Recommendations  AACE/ADA: New Consensus Statement on Inpatient Glycemic Control (2015)  Target Ranges:  Prepandial:   less than 140 mg/dL      Peak postprandial:   less than 180 mg/dL (1-2 hours)      Critically ill patients:  140 - 180 mg/dL   Lab Results  Component Value Date   GLUCAP 293 (H) 02/16/2017    Review of Glycemic ControlResults for [name], [name] (MRN [id number]) as of 02/16/2017 09:20  Ref. Range 02/15/2017 07:43 02/15/2017 11:38 02/15/2017 16:54 02/15/2017 20:35 02/16/2017 08:33  Glucose-Capillary Latest Ref Range: 65 - 99 mg/dL 230 (H) 227 (H) 291 (H) 332 (H) 293 (H)    Diabetes history: Type 2 diabetes Outpatient Diabetes medications: Levemir 20 units daily, Glipizide XL 10 mg daily Current orders for Inpatient glycemic control:  Novolog sensitive tid with meals  Inpatient Diabetes Program Recommendations:    If appropriate, please consider adding Levemir 10 units daily.   Thanks, Adah Perl, RN, BC-ADM Inpatient Diabetes Coordinator Pager [phone] (8a-5p)

## 2017-02-16 NOTE — Progress Notes (Signed)
1 unit pRBC's transfused without complication. Pt tolerated well, no signs or symptoms of transfusion reaction. Pt tolerated well, vitals stable. Continue to montor.

## 2017-02-16 NOTE — Progress Notes (Signed)
Patient taken off floor by orderly for paracentesis.

## 2017-02-16 NOTE — Progress Notes (Signed)
[facility] [place] NAME: Roberto Perez    MR#:  [id number]  DATE OF BIRTH:  Jan 09, 1951  SUBJECTIVE:  CHIEF COMPLAINT:   Chief Complaint  Patient presents with  . Anemia   -no complaints, RLE swelling>LLE -  Minimal ascites today and couldn't be tapped  REVIEW OF SYSTEMS:  Review of Systems  Constitutional: Positive for malaise/fatigue. Negative for chills and fever.  HENT: Negative for congestion, ear discharge, hearing loss and nosebleeds.   Eyes: Negative for blurred vision and double vision.  Respiratory: Negative for cough, shortness of breath and wheezing.   Cardiovascular: Positive for leg swelling. Negative for chest pain and palpitations.  Gastrointestinal: Positive for diarrhea. Negative for abdominal pain, constipation, nausea and vomiting.  Genitourinary: Negative for dysuria.  Neurological: Positive for tremors. Negative for dizziness, seizures and headaches.    DRUG ALLERGIES:  No Known Allergies  VITALS:  Blood pressure (!) 121/52, pulse 66, temperature 98.1 F (36.7 C), temperature source Oral, resp. rate 16, height 6\' 2"  (1.88 m), weight 117.5 kg (259 lb 1.6 oz), SpO2 99 %.  PHYSICAL EXAMINATION:  Physical Exam  GENERAL:  66 y.o.-year-old patient lying in the bed with no acute distress.  EYES: Pupils equal, round, reactive to light and accommodation. No scleral icterus. Extraocular muscles intact.  HEENT: Head atraumatic, normocephalic. Oropharynx and nasopharynx clear.  NECK:  Supple, no jugular venous distention. No thyroid enlargement, no tenderness.  LUNGS: Normal breath sounds bilaterally, no wheezing, rales,rhonchi or crepitation. No use of accessory muscles of respiration.  CARDIOVASCULAR: S1, S2 normal. No murmurs, rubs, or gallops.  ABDOMEN: Soft, distended, nontender. Bowel sounds present. No organomegaly or mass.  EXTREMITIES: No cyanosis, or clubbing. 1+ pedal edema, positive for mild  asterexis NEUROLOGIC: Cranial nerves II through XII are intact. Muscle strength 5/5 in all extremities. Sensation intact. Gait not checked. Global weakness noted. PSYCHIATRIC: The patient is alert and oriented x 2-3.  SKIN: No obvious rash, lesion, or ulcer.    LABORATORY PANEL:   CBC  Recent Labs Lab 02/16/17 0525  WBC 3.2*  HGB 8.0*  HCT 24.8*  PLT 49*   ------------------------------------------------------------------------------------------------------------------  Chemistries   Recent Labs Lab 02/15/17 0616 02/16/17 0525  NA 143 144  K 3.9 4.4  CL 112* 115*  CO2 26 26  GLUCOSE 246* 296*  BUN 53* 46*  CREATININE 1.73* 1.49*  CALCIUM 8.8* 9.0  AST 19  --   ALT 11*  --   ALKPHOS 83  --   BILITOT 0.9  --    ------------------------------------------------------------------------------------------------------------------  Cardiac Enzymes No results for input(s): TROPONINI in the last 168 hours. ------------------------------------------------------------------------------------------------------------------  RADIOLOGY:  No results found.  EKG:   Orders placed or performed during the hospital encounter of 02/12/17  . EKG 12-Lead  . EKG 12-Lead    ASSESSMENT AND PLAN:   MelvinElliottis a 66 y.o.malewith a known history of Severe iron deficiency anemia which is chronic has had several blood transfusions in the recent past most recent admission at [facility] from March 28 to April 3 for severe anemia where he ended up getting 7 units of blood transfusion. Patient has history of CK D stage III creatinine around 1.9, type 2 diabetes, nonalcoholic fatty liver disease/cirrhosis with portal hypertension and ascites and pancytopenia comes to the emergency room from [place] with altered mental status.  1. Altered mental status/acute encephalopathy appears metabolic/hepatic/Sepsis -Ammonia of 163 on admission- now at 60 - on lactulose -improved mentation  2.  Liver cirrhosis with ascites- -Patient has history of nonalcoholic fatty liver disease/cirrhosis of liver with complicatedascites, coagulopathy, portal hypertension, esophageal varices, pancytopenia -s/p Paracentesis ultrasound-guided therapeutic---fluid positive for 2900 WBC c/w SBP (already on IV Rocephin) -IV albumin x 3 today -GI consultation appreciated  3. Severe iron deficiency anemia with significant chronic blood loss anemia -Patient's baseline hemoglobin is  At 7 -He recently had 7 units of blood transfusion last week at [facility] -Extensive workup has been done at [facility] in the form of EGD and colonoscopy which showed no variances however has history of varices status post banding in the past -Capsule endoscopy showed multiple mucosal defects throughout the entire bowel -We'll continue Protonix daily. -Continue oral iron - 1 unit PRBC received and hb at 8  4. Acute on chronic renal failure -Multi-factorial appears hepatorenal syndrome with poor intravascular volume along with history of diabetes -Nephrology consultation noted.  -Baseline creatinine 1.7-1.9. IV albumin again today - lasix and aldactone once renal function improves  5. MRSA/Strep Sepsis with UTI -IV Rocephin and IV vancomycin Follow-up urine cultures shows multiple species -ID consulted. ECHO with no vegetations. Repeat blood cultures ordered and negative so far -await ID input for discharge antibiotics  6. DVT prophylaxis SCD teds Patient has chronic thrombocytopenia. avoid a antiplatelet agents.  7. Type 2 diabetes -Sliding-scale insulin  8. Patient carries a very poor prognosis given his multitude of medical problems . Palliative care consultation.pt is now DNR     All the records are reviewed and case discussed with Care Management/Social Workerr. Management plans discussed with the patient, family and they are in agreement.  CODE STATUS: Full Code  TOTAL TIME TAKING CARE OF THIS PATIENT: 32  minutes.   POSSIBLE D/C TOMORROW, DEPENDING ON CLINICAL CONDITION.   Gladstone Lighter M.D on 02/16/2017 at 2:40 PM  Between 7am to 6pm - Pager - [phone]  After 6pm go to [email or web address] - password [email or web address] [facility]  Office  [phone]  CC: Primary care physician; Gayland Curry, MD

## 2017-02-16 NOTE — Progress Notes (Signed)
ANTIBIOTIC CONSULT NOTE  Pharmacy Consult for Vancomycin Indication: bacteremia  No Known Allergies  Patient Measurements: Height: 6\' 2"  (188 cm) Weight: 259 lb 1.6 oz (117.5 kg) IBW/kg (Calculated) : 82.2 Adjusted Body Weight: 96.32 kg   Vital Signs: Temp: 97.5 F (36.4 C) (04/09 1521) Temp Source: Oral (04/09 1521) BP: 119/55 (04/09 1521) Pulse Rate: 67 (04/09 1521) Intake/Output from previous day: 04/08 0701 - 04/09 0700 In: 1663.7 [I.V.:1326.7; Blood:287; IV Piggyback:50] Out: 7867 [Urine:1850] Intake/Output from this shift: Total I/O In: 1649.2 [P.O.:960; I.V.:289.2; IV Piggyback:400] Out: 650 [Urine:650]  Labs:  Recent Labs  02/13/17 1752  02/14/17 1127 02/15/17 0616 02/16/17 0525  WBC  --   --  3.3* 2.5* 3.2*  HGB  --   --  8.3* 7.0* 8.0*  PLT  --   --  69* 52* 49*  LABCREA 77  --   --   --   --   CREATININE  --   < > 2.01* 1.73* 1.49*  < > = values in this interval not displayed. Estimated Creatinine Clearance: 66.4 mL/min (A) (by C-G formula based on SCr of 1.49 mg/dL (H)).  Recent Labs  02/15/17 0954 02/16/17 1131  VANCORANDOM 40 24     Microbiology: Recent Results (from the past 720 hour(s))  Blood culture (routine x 2)     Status: Abnormal (Preliminary result)   Collection Time: 02/12/17  5:17 AM  Result Value Ref Range Status   Specimen Description BLOOD R FA  Final   Special Requests   Final    BOTTLES DRAWN AEROBIC AND ANAEROBIC Blood Culture adequate volume   Culture  Setup Time   Final    GRAM POSITIVE COCCI IN BOTH AEROBIC AND ANAEROBIC BOTTLES CRITICAL RESULT CALLED TO, READ BACK BY AND VERIFIED WITH: JASON ROBBINS AT 2037 02/12/2017 BY TFK MATT MCBANE @ 6720 ON 02/13/2017 BY CAF    Culture (A)  Final    STAPHYLOCOCCUS AUREUS GROUP B STREP(S.AGALACTIAE)ISOLATED SUSCEPTIBILITIES TO FOLLOW STAPHYLOCOCCUS EPIDERMIDIS STAPHYLOCOCCUS HAEMOLYTICUS THE SIGNIFICANCE OF ISOLATING THIS ORGANISM FROM A SINGLE SET OF BLOOD CULTURES WHEN  MULTIPLE SETS ARE DRAWN IS UNCERTAIN. PLEASE NOTIFY THE MICROBIOLOGY DEPARTMENT WITHIN ONE WEEK IF SPECIATION AND SENSITIVITIES ARE REQUIRED.    Report Status PENDING  Incomplete   Organism ID, Bacteria GROUP B STREP(S.AGALACTIAE)ISOLATED  Final      Susceptibility   Group b strep(s.agalactiae)isolated - MIC*    CLINDAMYCIN <=0.25 SENSITIVE Sensitive     AMPICILLIN <=0.25 SENSITIVE Sensitive     ERYTHROMYCIN <=0.12 SENSITIVE Sensitive     VANCOMYCIN 0.5 SENSITIVE Sensitive     CEFTRIAXONE <=0.12 SENSITIVE Sensitive     LEVOFLOXACIN Value in next row Sensitive      1 SENSITIVEPerformed at [facility] [address] [place], [place] [address]    * GROUP B STREP(S.AGALACTIAE)ISOLATED  Blood culture (routine x 2)     Status: Abnormal   Collection Time: 02/12/17  5:17 AM  Result Value Ref Range Status   Specimen Description BLOOD R H  Final   Special Requests   Final    BOTTLES DRAWN AEROBIC AND ANAEROBIC Blood Culture adequate volume   Culture  Setup Time   Final    GRAM POSITIVE COCCI ANAEROBIC BOTTLE ONLY CRITICAL RESULT CALLED TO, READ BACK BY AND VERIFIED WITH: MATT MCBANE @ 6283 ON 02/13/2017 BY CAF    Culture (A)  Final    STAPHYLOCOCCUS CAPITIS THE SIGNIFICANCE OF ISOLATING THIS ORGANISM FROM A SINGLE SET OF BLOOD CULTURES  WHEN MULTIPLE SETS ARE DRAWN IS UNCERTAIN. PLEASE NOTIFY THE MICROBIOLOGY DEPARTMENT WITHIN ONE WEEK IF SPECIATION AND SENSITIVITIES ARE REQUIRED. Performed at [facility], [place] [address] [place], [place] [address]    Report Status 02/15/2017 FINAL  Final  Urine culture     Status: Abnormal   Collection Time: 02/12/17  5:17 AM  Result Value Ref Range Status   Specimen Description URINE, RANDOM  Final   Special Requests NONE  Final   Culture MULTIPLE SPECIES PRESENT, SUGGEST RECOLLECTION (A)  Final   Report Status 02/13/2017 FINAL  Final  Blood Culture ID Panel (Reflexed)     Status: Abnormal   Collection Time: 02/12/17  5:17 AM  Result  Value Ref Range Status   Enterococcus species NOT DETECTED NOT DETECTED Final   Listeria monocytogenes NOT DETECTED NOT DETECTED Final   Staphylococcus species DETECTED (A) NOT DETECTED Final    Comment: CRITICAL RESULT CALLED TO, READ BACK BY AND VERIFIED WITH: JASON ROBBINS AT 2037 02/12/2017 BY TFK    Staphylococcus aureus DETECTED (A) NOT DETECTED Final    Comment: Methicillin (oxacillin)-resistant Staphylococcus aureus (MRSA). MRSA is predictably resistant to beta-lactam antibiotics (except ceftaroline). Preferred therapy is vancomycin unless clinically contraindicated. Patient requires contact precautions if  hospitalized. CRITICAL RESULT CALLED TO, READ BACK BY AND VERIFIED WITH: JASON ROBBINS AT 2037 02/12/2017 BY TFK    Methicillin resistance DETECTED (A) NOT DETECTED Final    Comment: CRITICAL RESULT CALLED TO, READ BACK BY AND VERIFIED WITH: JASON ROBBINS AT 2037 02/12/2017 BY TFK    Streptococcus species DETECTED (A) NOT DETECTED Final    Comment: CRITICAL RESULT CALLED TO, READ BACK BY AND VERIFIED WITH: JASON ROBBINS AT 2037 02/12/2017 BY TFK    Streptococcus agalactiae DETECTED (A) NOT DETECTED Final    Comment: CRITICAL RESULT CALLED TO, READ BACK BY AND VERIFIED WITH: JASON ROBBINS AT 2037 02/12/2017 BY TFK    Streptococcus pneumoniae NOT DETECTED NOT DETECTED Final   Streptococcus pyogenes NOT DETECTED NOT DETECTED Final   Acinetobacter baumannii NOT DETECTED NOT DETECTED Final   Enterobacteriaceae species NOT DETECTED NOT DETECTED Final   Enterobacter cloacae complex NOT DETECTED NOT DETECTED Final   Escherichia coli NOT DETECTED NOT DETECTED Final   Klebsiella oxytoca NOT DETECTED NOT DETECTED Final   Klebsiella pneumoniae NOT DETECTED NOT DETECTED Final   Proteus species NOT DETECTED NOT DETECTED Final   Serratia marcescens NOT DETECTED NOT DETECTED Final   Haemophilus influenzae NOT DETECTED NOT DETECTED Final   Neisseria meningitidis NOT DETECTED NOT  DETECTED Final   Pseudomonas aeruginosa NOT DETECTED NOT DETECTED Final   Candida albicans NOT DETECTED NOT DETECTED Final   Candida glabrata NOT DETECTED NOT DETECTED Final   Candida krusei NOT DETECTED NOT DETECTED Final   Candida parapsilosis NOT DETECTED NOT DETECTED Final   Candida tropicalis NOT DETECTED NOT DETECTED Final  MRSA PCR Screening     Status: None   Collection Time: 02/12/17  3:03 PM  Result Value Ref Range Status   MRSA by PCR NEGATIVE NEGATIVE Final    Comment:        The GeneXpert MRSA Assay (FDA approved for NASAL specimens only), is one component of a comprehensive MRSA colonization surveillance program. It is not intended to diagnose MRSA infection nor to guide or monitor treatment for MRSA infections.   Aerobic/Anaerobic Culture (surgical/deep wound)     Status: None   Collection Time: 02/13/17  3:00 PM  Result Value Ref Range Status  Specimen Description WOUND  Final   Special Requests NO ANAEROBIC SWAB SENT  Final   Culture   Final    SPECIMEN/CONTAINER TYPE INAPPROPRIATE FOR ORDERED TEST, UNABLE TO PERFORM NOTIFIED RN KRISTA AT 1142 02/15/17. RN TO REORDER AS AEROBIC CULTURE ONLY. Performed at [facility], [place] [address], [place], [place] [address]    Report Status 02/15/2017 FINAL  Final  Aerobic Culture (superficial specimen)     Status: None   Collection Time: 02/13/17  3:00 PM  Result Value Ref Range Status   Specimen Description LEG LEFT LOWER  Final   Special Requests SWAB  Final   Gram Stain   Final    FEW WBC PRESENT,BOTH PMN AND MONONUCLEAR NO ORGANISMS SEEN    Culture   Final    RARE METHICILLIN RESISTANT STAPHYLOCOCCUS AUREUS WITHIN MIXED CULTURE Performed at [facility], [place] [address], [place], [place] [address]    Report Status 02/16/2017 FINAL  Final   Organism ID, Bacteria METHICILLIN RESISTANT STAPHYLOCOCCUS AUREUS  Final      Susceptibility   Methicillin resistant staphylococcus aureus - MIC*     CIPROFLOXACIN >=8 RESISTANT Resistant     ERYTHROMYCIN >=8 RESISTANT Resistant     GENTAMICIN <=0.5 SENSITIVE Sensitive     OXACILLIN >=4 RESISTANT Resistant     TETRACYCLINE 2 SENSITIVE Sensitive     VANCOMYCIN 1 SENSITIVE Sensitive     TRIMETH/SULFA >=320 RESISTANT Resistant     CLINDAMYCIN >=8 RESISTANT Resistant     RIFAMPIN <=0.5 SENSITIVE Sensitive     Inducible Clindamycin NEGATIVE Sensitive     * RARE METHICILLIN RESISTANT STAPHYLOCOCCUS AUREUS  Culture, blood (single) w Reflex to ID Panel     Status: None (Preliminary result)   Collection Time: 02/13/17  5:28 PM  Result Value Ref Range Status   Specimen Description BLOOD L AC  Final   Special Requests BOTTLES DRAWN AEROBIC AND ANAEROBIC [place]  Final   Culture NO GROWTH 3 DAYS  Final   Report Status PENDING  Incomplete    Medical History: Past Medical History:  Diagnosis Date  . Anemia     Medications:  Prescriptions Prior to Admission  Medication Sig Dispense Refill Last Dose  . allopurinol (ZYLOPRIM) 300 MG tablet Take 300 mg by mouth daily.   02/11/2017 at 0800  . carvedilol (COREG) 3.125 MG tablet Take 3.125 mg by mouth 2 (two) times daily.   02/11/2017 at 2000  . glipiZIDE (GLUCOTROL XL) 10 MG 24 hr tablet Take 10 mg by mouth daily.   02/11/2017 at 0800  . lactulose (CHRONULAC) 10 GM/15ML solution Take 15 mLs by mouth 3 (three) times daily.   02/11/2017 at 2000  . LEVEMIR 100 UNIT/ML injection Inject 20 Units into the skin daily.   02/11/2017 at 0800  . levothyroxine (SYNTHROID, LEVOTHROID) 200 MCG tablet Take 200 mcg by mouth daily.   02/12/2017 at 0600  . pantoprazole (PROTONIX) 40 MG tablet Take 40 mg by mouth 2 (two) times daily.   02/11/2017 at 2000  . spironolactone (ALDACTONE) 100 MG tablet Take 50 mg by mouth daily.   02/11/2017 at 0800   Assessment: Pharmacy consulted to dose vancomycin in this 65 year old male with blood Cx growing Strep agalactiae and Staph A (Mech A +) per BCID ([facility] in 2/2). Patient is also on  ceftriaxone for UTI and possible SBP.   Goal of Therapy:  Vancomycin trough level 15-20 mcg/ml  Plan:  ID will assess patient to  determine continued need for vancomycin based on culture results that may represent contamination.  ke= 0.02 based on patient specific parameters t1/2 of about 34 hours  If vancomycin is to continue, will check another level to ensure clearance before resuming vancomycin 1250 mg iv q 36 hours form 4/10 at 1130.    Ulice Dash D 02/16/2017,3:56 PM

## 2017-02-16 NOTE — Progress Notes (Signed)
Patient returned to room 112.  No paracentesis preformed.

## 2017-02-16 NOTE — Progress Notes (Signed)
[place] INFECTIOUS DISEASE PROGRESS NOTE Date of Admission:  02/12/2017     ID: Roberto Perez is a 66 y.o. male with  Mixed bacteremia, ascites, SBP Active Problems:   Encephalopathy   Ascites   End stage liver disease (HCC)   Palliative care encounter   DNR (do not resuscitate)   Subjective: He has had no fevers, more alert, had echo and repeat bcx.   ROS  Eleven systems are reviewed and negative except per hpi  Medications:  Antibiotics Given (last 72 hours)    Date/Time Action Medication Dose Rate   02/14/17 0324 Given  [Verified Vancomycin 1250mg  /NS 277ml]   vancomycin (VANCOCIN) 1,250 mg in sodium chloride 0.9 % 250 mL IVPB 1,250 mg 166.7 mL/hr   02/14/17 0803 Given   cefTRIAXone (ROCEPHIN) 1 g in dextrose 5 % 50 mL IVPB 1 g 100 mL/hr   02/14/17 2233 Given  [Per patient preference]   vancomycin (VANCOCIN) 1,250 mg in sodium chloride 0.9 % 250 mL IVPB 1,250 mg 166.7 mL/hr   02/15/17 0800 Given   cefTRIAXone (ROCEPHIN) 1 g in dextrose 5 % 50 mL IVPB 1 g 100 mL/hr   02/16/17 0906 Given   cefTRIAXone (ROCEPHIN) 1 g in dextrose 5 % 50 mL IVPB 1 g 100 mL/hr   02/16/17 1520 Given   vancomycin (VANCOCIN) 1,250 mg in sodium chloride 0.9 % 250 mL IVPB 1,250 mg 166.7 mL/hr     . albumin human  25 g Intravenous Q8H  . ammonium lactate   Topical BID  . cefTRIAXone (ROCEPHIN)  IV  1 g Intravenous Q24H  . ferrous sulfate  325 mg Oral BID WC  . insulin aspart  0-20 Units Subcutaneous TID WC  . insulin aspart  0-5 Units Subcutaneous QHS  . insulin glargine  15 Units Subcutaneous QHS  . lactulose  10 g Oral TID  . levothyroxine  200 mcg Oral BH-q7a  . multivitamin with minerals  1 tablet Oral Daily  . nystatin   Topical TID  . pantoprazole  40 mg Oral BID  . spironolactone  50 mg Oral Daily  . [START ON 02/17/2017] vancomycin  1,250 mg Intravenous Q36H    Objective: Vital signs in last 24 hours: Temp:  [97.4 F (36.3 C)-98.1 F (36.7 C)] 97.5 F (36.4 C) (04/09  1521) Pulse Rate:  [66-75] 67 (04/09 1521) Resp:  [16-22] 16 (04/09 1521) BP: (119-132)/(52-65) 119/55 (04/09 1521) SpO2:  [96 %-99 %] 97 % (04/09 1521) Constitutional: He is more alert and interactive appearing, dishelved HENT: anicteric Mouth/Throat: Oropharynx is clear and moist. Very poor dentition No oropharyngeal exudate.  Cardiovascular: Normal rate, regular rhythm and normal heart sounds.  Pulmonary/Chest: Effort normal and breath sounds normal. No respiratory distress. He has no wheezes.  Abdominal: Soft. Obese, + ascites Bowel sounds are normal. He exhibits no distension. There is no tenderness.  Lymphadenopathy: He has no cervical adenopathy.  Neurological: He is alert and oriented to person, place, and time. + asterixs Slowed mentation  Ext LLE with 2+ edema Skin: LLE with chronic lymphededa and changes of dermatitis. Apprx 3x 2 cm ulcer with mild purulence ant shin Psychiatric: slowed mentation   Lab Results  Recent Labs  02/15/17 0616 02/16/17 0525  WBC 2.5* 3.2*  HGB 7.0* 8.0*  HCT 21.9* 24.8*  NA 143 144  K 3.9 4.4  CL 112* 115*  CO2 26 26  BUN 53* 46*  CREATININE 1.73* 1.49*    Microbiology: Results for orders placed or  performed during the hospital encounter of 02/12/17  Blood culture (routine x 2)     Status: Abnormal (Preliminary result)   Collection Time: 02/12/17  5:17 AM  Result Value Ref Range Status   Specimen Description BLOOD R FA  Final   Special Requests   Final    BOTTLES DRAWN AEROBIC AND ANAEROBIC Blood Culture adequate volume   Culture  Setup Time   Final    GRAM POSITIVE COCCI IN BOTH AEROBIC AND ANAEROBIC BOTTLES CRITICAL RESULT CALLED TO, READ BACK BY AND VERIFIED WITH: JASON ROBBINS AT 2037 02/12/2017 BY TFK MATT MCBANE @ 8768 ON 02/13/2017 BY CAF    Culture (A)  Final    STAPHYLOCOCCUS AUREUS GROUP B STREP(S.AGALACTIAE)ISOLATED SUSCEPTIBILITIES TO FOLLOW STAPHYLOCOCCUS EPIDERMIDIS STAPHYLOCOCCUS HAEMOLYTICUS THE SIGNIFICANCE  OF ISOLATING THIS ORGANISM FROM A SINGLE SET OF BLOOD CULTURES WHEN MULTIPLE SETS ARE DRAWN IS UNCERTAIN. PLEASE NOTIFY THE MICROBIOLOGY DEPARTMENT WITHIN ONE WEEK IF SPECIATION AND SENSITIVITIES ARE REQUIRED.    Report Status PENDING  Incomplete   Organism ID, Bacteria GROUP B STREP(S.AGALACTIAE)ISOLATED  Final      Susceptibility   Group b strep(s.agalactiae)isolated - MIC*    CLINDAMYCIN <=0.25 SENSITIVE Sensitive     AMPICILLIN <=0.25 SENSITIVE Sensitive     ERYTHROMYCIN <=0.12 SENSITIVE Sensitive     VANCOMYCIN 0.5 SENSITIVE Sensitive     CEFTRIAXONE <=0.12 SENSITIVE Sensitive     LEVOFLOXACIN Value in next row Sensitive      1 SENSITIVEPerformed at [facility] [address], [place], [place] [address]    * GROUP B STREP(S.AGALACTIAE)ISOLATED  Blood culture (routine x 2)     Status: Abnormal   Collection Time: 02/12/17  5:17 AM  Result Value Ref Range Status   Specimen Description BLOOD R H  Final   Special Requests   Final    BOTTLES DRAWN AEROBIC AND ANAEROBIC Blood Culture adequate volume   Culture  Setup Time   Final    GRAM POSITIVE COCCI ANAEROBIC BOTTLE ONLY CRITICAL RESULT CALLED TO, READ BACK BY AND VERIFIED WITH: MATT MCBANE @ 6203 ON 02/13/2017 BY CAF    Culture (A)  Final    STAPHYLOCOCCUS CAPITIS THE SIGNIFICANCE OF ISOLATING THIS ORGANISM FROM A SINGLE SET OF BLOOD CULTURES WHEN MULTIPLE SETS ARE DRAWN IS UNCERTAIN. PLEASE NOTIFY THE MICROBIOLOGY DEPARTMENT WITHIN ONE WEEK IF SPECIATION AND SENSITIVITIES ARE REQUIRED. Performed at [facility], [place] [address] [place], [place] [address]    Report Status 02/15/2017 FINAL  Final  Urine culture     Status: Abnormal   Collection Time: 02/12/17  5:17 AM  Result Value Ref Range Status   Specimen Description URINE, RANDOM  Final   Special Requests NONE  Final   Culture MULTIPLE SPECIES PRESENT, SUGGEST RECOLLECTION (A)  Final   Report Status 02/13/2017 FINAL  Final  Blood Culture ID Panel (Reflexed)      Status: Abnormal   Collection Time: 02/12/17  5:17 AM  Result Value Ref Range Status   Enterococcus species NOT DETECTED NOT DETECTED Final   Listeria monocytogenes NOT DETECTED NOT DETECTED Final   Staphylococcus species DETECTED (A) NOT DETECTED Final    Comment: CRITICAL RESULT CALLED TO, READ BACK BY AND VERIFIED WITH: JASON ROBBINS AT 2037 02/12/2017 BY TFK    Staphylococcus aureus DETECTED (A) NOT DETECTED Final    Comment: Methicillin (oxacillin)-resistant Staphylococcus aureus (MRSA). MRSA is predictably resistant to beta-lactam antibiotics (except ceftaroline). Preferred therapy is vancomycin unless clinically contraindicated. Patient requires contact  precautions if  hospitalized. CRITICAL RESULT CALLED TO, READ BACK BY AND VERIFIED WITH: JASON ROBBINS AT 2037 02/12/2017 BY TFK    Methicillin resistance DETECTED (A) NOT DETECTED Final    Comment: CRITICAL RESULT CALLED TO, READ BACK BY AND VERIFIED WITH: JASON ROBBINS AT 2037 02/12/2017 BY TFK    Streptococcus species DETECTED (A) NOT DETECTED Final    Comment: CRITICAL RESULT CALLED TO, READ BACK BY AND VERIFIED WITH: JASON ROBBINS AT 2037 02/12/2017 BY TFK    Streptococcus agalactiae DETECTED (A) NOT DETECTED Final    Comment: CRITICAL RESULT CALLED TO, READ BACK BY AND VERIFIED WITH: JASON ROBBINS AT 2037 02/12/2017 BY TFK    Streptococcus pneumoniae NOT DETECTED NOT DETECTED Final   Streptococcus pyogenes NOT DETECTED NOT DETECTED Final   Acinetobacter baumannii NOT DETECTED NOT DETECTED Final   Enterobacteriaceae species NOT DETECTED NOT DETECTED Final   Enterobacter cloacae complex NOT DETECTED NOT DETECTED Final   Escherichia coli NOT DETECTED NOT DETECTED Final   Klebsiella oxytoca NOT DETECTED NOT DETECTED Final   Klebsiella pneumoniae NOT DETECTED NOT DETECTED Final   Proteus species NOT DETECTED NOT DETECTED Final   Serratia marcescens NOT DETECTED NOT DETECTED Final   Haemophilus influenzae NOT DETECTED NOT  DETECTED Final   Neisseria meningitidis NOT DETECTED NOT DETECTED Final   Pseudomonas aeruginosa NOT DETECTED NOT DETECTED Final   Candida albicans NOT DETECTED NOT DETECTED Final   Candida glabrata NOT DETECTED NOT DETECTED Final   Candida krusei NOT DETECTED NOT DETECTED Final   Candida parapsilosis NOT DETECTED NOT DETECTED Final   Candida tropicalis NOT DETECTED NOT DETECTED Final  MRSA PCR Screening     Status: None   Collection Time: 02/12/17  3:03 PM  Result Value Ref Range Status   MRSA by PCR NEGATIVE NEGATIVE Final    Comment:        The GeneXpert MRSA Assay (FDA approved for NASAL specimens only), is one component of a comprehensive MRSA colonization surveillance program. It is not intended to diagnose MRSA infection nor to guide or monitor treatment for MRSA infections.   Aerobic/Anaerobic Culture (surgical/deep wound)     Status: None   Collection Time: 02/13/17  3:00 PM  Result Value Ref Range Status   Specimen Description WOUND  Final   Special Requests NO ANAEROBIC SWAB SENT  Final   Culture   Final    SPECIMEN/CONTAINER TYPE INAPPROPRIATE FOR ORDERED TEST, UNABLE TO PERFORM NOTIFIED RN KRISTA AT 1142 02/15/17. RN TO REORDER AS AEROBIC CULTURE ONLY. Performed at [facility], [place] [address] [place], [place] [address]    Report Status 02/15/2017 FINAL  Final  Aerobic Culture (superficial specimen)     Status: None   Collection Time: 02/13/17  3:00 PM  Result Value Ref Range Status   Specimen Description LEG LEFT LOWER  Final   Special Requests SWAB  Final   Gram Stain   Final    FEW WBC PRESENT,BOTH PMN AND MONONUCLEAR NO ORGANISMS SEEN    Culture   Final    RARE METHICILLIN RESISTANT STAPHYLOCOCCUS AUREUS WITHIN MIXED CULTURE Performed at [facility], [place] [address], [place], [place] [address]    Report Status 02/16/2017 FINAL  Final   Organism ID, Bacteria METHICILLIN RESISTANT STAPHYLOCOCCUS AUREUS  Final      Susceptibility    Methicillin resistant staphylococcus aureus - MIC*    CIPROFLOXACIN >=8 RESISTANT Resistant     ERYTHROMYCIN >=8 RESISTANT Resistant  GENTAMICIN <=0.5 SENSITIVE Sensitive     OXACILLIN >=4 RESISTANT Resistant     TETRACYCLINE 2 SENSITIVE Sensitive     VANCOMYCIN 1 SENSITIVE Sensitive     TRIMETH/SULFA >=320 RESISTANT Resistant     CLINDAMYCIN >=8 RESISTANT Resistant     RIFAMPIN <=0.5 SENSITIVE Sensitive     Inducible Clindamycin NEGATIVE Sensitive     * RARE METHICILLIN RESISTANT STAPHYLOCOCCUS AUREUS  Culture, blood (single) w Reflex to ID Panel     Status: None (Preliminary result)   Collection Time: 02/13/17  5:28 PM  Result Value Ref Range Status   Specimen Description BLOOD L AC  Final   Special Requests BOTTLES DRAWN AEROBIC AND ANAEROBIC [place]  Final   Culture NO GROWTH 3 DAYS  Final   Report Status PENDING  Incomplete    Studies/Results: No results found.  Assessment/Plan: Kerrington Greenhalgh is a 66 y.o. 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 He has one BCX + for four bacteria -Staph aureus, GBS,  Staph epi and Staph Haemolyticus. The second set is positive for only strep capitis. UCX mixed, wound cx MRSA Has evidence of SBP on Paracentesis with wbc 2967, 71% PMN but no culture sent.   He has RLE edema and a wound with some purulence with cx MRSA.  Repeat bcx is negative. Echo negative Clinically improving.   At this point I am not convinced he even had bacteremia with mixed cultures. I do not usually consider staphaurues a contaminant but in this case it seems to be. Has been on ctx and vanco day 5  Recommendations Would change at DC to oral doxy for the MRSA in the leg wound- rec 14 day total course  From admit- stop date 02/25/17 For SBP would empirically treat for 5 more days- would change to oral levofloxacin at DC to complete a 10 day total abx course - stop date 4/15  Following completion of treatment above would start cipro 500 mg qd   orally for SBP prophylaxis until he is seen in follow up with GI.    Thank you very much for the consult. Will follow with you.  Veronica Fretz P   02/16/2017, 4:00 PM

## 2017-02-16 NOTE — Progress Notes (Signed)
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   Cannot Anticoagulate given chronic liver disease and thrombocytopenia. Will have primary doc. Address this with patient in the a.m.  If needed consider Vascular consult for IVC placement.

## 2017-02-16 NOTE — Progress Notes (Signed)
  Interventional Radiology:  Ultrasound today shows no significant re-accumulation of ascites.  Fairly small amount of ascites scattered in peritoneal cavity. No paracentesis performed today.   Roberto Perez. Kathlene Cote, M.D Pager:  [phone]

## 2017-02-17 DIAGNOSIS — N390 Urinary tract infection, site not specified: Secondary | ICD-10-CM | POA: Diagnosis not present

## 2017-02-17 DIAGNOSIS — M6281 Muscle weakness (generalized): Secondary | ICD-10-CM | POA: Diagnosis not present

## 2017-02-17 DIAGNOSIS — Z741 Need for assistance with personal care: Secondary | ICD-10-CM | POA: Diagnosis not present

## 2017-02-17 DIAGNOSIS — K7290 Hepatic failure, unspecified without coma: Secondary | ICD-10-CM | POA: Diagnosis not present

## 2017-02-17 DIAGNOSIS — R7881 Bacteremia: Secondary | ICD-10-CM | POA: Diagnosis not present

## 2017-02-17 DIAGNOSIS — K7460 Unspecified cirrhosis of liver: Secondary | ICD-10-CM | POA: Diagnosis not present

## 2017-02-17 DIAGNOSIS — Z7401 Bed confinement status: Secondary | ICD-10-CM | POA: Diagnosis not present

## 2017-02-17 DIAGNOSIS — A419 Sepsis, unspecified organism: Secondary | ICD-10-CM | POA: Diagnosis not present

## 2017-02-17 DIAGNOSIS — Z66 Do not resuscitate: Secondary | ICD-10-CM | POA: Diagnosis not present

## 2017-02-17 DIAGNOSIS — R2681 Unsteadiness on feet: Secondary | ICD-10-CM | POA: Diagnosis not present

## 2017-02-17 DIAGNOSIS — N179 Acute kidney failure, unspecified: Secondary | ICD-10-CM | POA: Diagnosis not present

## 2017-02-17 DIAGNOSIS — R262 Difficulty in walking, not elsewhere classified: Secondary | ICD-10-CM | POA: Diagnosis not present

## 2017-02-17 DIAGNOSIS — N183 Chronic kidney disease, stage 3 (moderate): Secondary | ICD-10-CM | POA: Diagnosis not present

## 2017-02-17 DIAGNOSIS — G939 Disorder of brain, unspecified: Secondary | ICD-10-CM | POA: Diagnosis not present

## 2017-02-17 DIAGNOSIS — E1122 Type 2 diabetes mellitus with diabetic chronic kidney disease: Secondary | ICD-10-CM | POA: Diagnosis not present

## 2017-02-17 DIAGNOSIS — Z515 Encounter for palliative care: Secondary | ICD-10-CM | POA: Diagnosis not present

## 2017-02-17 DIAGNOSIS — N3000 Acute cystitis without hematuria: Secondary | ICD-10-CM | POA: Diagnosis not present

## 2017-02-17 DIAGNOSIS — G9340 Encephalopathy, unspecified: Secondary | ICD-10-CM | POA: Diagnosis not present

## 2017-02-17 DIAGNOSIS — D631 Anemia in chronic kidney disease: Secondary | ICD-10-CM | POA: Diagnosis not present

## 2017-02-17 DIAGNOSIS — R1310 Dysphagia, unspecified: Secondary | ICD-10-CM | POA: Diagnosis not present

## 2017-02-17 DIAGNOSIS — D649 Anemia, unspecified: Secondary | ICD-10-CM | POA: Diagnosis not present

## 2017-02-17 DIAGNOSIS — D638 Anemia in other chronic diseases classified elsewhere: Secondary | ICD-10-CM | POA: Diagnosis not present

## 2017-02-17 DIAGNOSIS — D500 Iron deficiency anemia secondary to blood loss (chronic): Secondary | ICD-10-CM | POA: Diagnosis not present

## 2017-02-17 LAB — CULTURE, BLOOD (ROUTINE X 2): Special Requests: ADEQUATE

## 2017-02-17 LAB — GLUCOSE, CAPILLARY: Glucose-Capillary: 232 mg/dL — ABNORMAL HIGH (ref 65–99)

## 2017-02-17 LAB — PROTEIN ELECTRO, RANDOM URINE
ALPHA-1-GLOBULIN, U: 3.2 %
ALPHA-2-GLOBULIN, U: 8.1 %
Albumin ELP, Urine: 38.3 %
Beta Globulin, U: 19.8 %
Gamma Globulin, U: 30.6 %
TOTAL PROTEIN, URINE-UPE24: 14.7 mg/dL

## 2017-02-17 LAB — HEMOGLOBIN A1C
Hgb A1c MFr Bld: 5.7 % — ABNORMAL HIGH (ref 4.8–5.6)
Mean Plasma Glucose: 117 mg/dL

## 2017-02-17 MED ORDER — LEVOFLOXACIN 500 MG PO TABS: 500.0000 mg | ORAL_TABLET | Freq: Every day | ORAL | 0 refills | Status: AC

## 2017-02-17 MED ORDER — LACTULOSE 10 GM/15ML PO SOLN: 20.0000 g | Freq: Three times a day (TID) | ORAL | 0 refills | Status: AC

## 2017-02-17 MED ORDER — CIPROFLOXACIN HCL 500 MG PO TABS: 500.0000 mg | ORAL_TABLET | Freq: Every day | ORAL | 2 refills | Status: AC

## 2017-02-17 MED ORDER — DOXYCYCLINE HYCLATE 100 MG PO CAPS: 100.0000 mg | ORAL_CAPSULE | Freq: Two times a day (BID) | ORAL | 0 refills | Status: AC

## 2017-02-17 MED ORDER — AMMONIUM LACTATE 12 % EX LOTN: TOPICAL_LOTION | Freq: Two times a day (BID) | CUTANEOUS | 0 refills | Status: AC

## 2017-02-17 MED ORDER — FERROUS SULFATE 325 (65 FE) MG PO TABS: 325.0000 mg | ORAL_TABLET | Freq: Two times a day (BID) | ORAL | 2 refills | Status: AC

## 2017-02-17 NOTE — Plan of Care (Signed)
EMS on Unit for transport. Discharge paperwork provided to EMS transporters. Belongings sent with patient and EMS.

## 2017-02-17 NOTE — Progress Notes (Signed)
New referral for Palliative services at [facility] [place] facility received from [place] following a Palliative Medicine consult. Planned discharge today. Referral made aware. Thank you. Flo Shanks RN, BSN, [facility] [place], hospital liaison [phone] c

## 2017-02-17 NOTE — Progress Notes (Signed)
[facility]  ROUNDING NOTE   Subjective:   To be discharged today.  Examined this morning.   Objective:  Vital signs in last 24 hours:  Temp:  [97.5 F (36.4 C)-98.6 F (37 C)] 98.1 F (36.7 C) (04/10 0849) Pulse Rate:  [65-72] 70 (04/10 0849) Resp:  [16-20] 18 (04/10 0849) BP: (115-129)/(50-55) 115/50 (04/10 0849) SpO2:  [96 %-97 %] 97 % (04/10 0849)  Weight change:  Filed Weights   02/12/17 0509 02/12/17 1230  Weight: 119.7 kg (264 lb) 117.5 kg (259 lb 1.6 oz)    Intake/Output: I/O last 3 completed shifts: In: 2764.5 [P.O.:960; I.V.:1017.5; Blood:287; IV HQIONGEXB:284] Out: 2650 [Urine:2650]   Intake/Output this shift:  Total I/O In: -  Out: 325 [Urine:325]  Physical Exam: General: Laying in bed  Head: Normocephalic, atraumatic. Moist oral mucosal membranes  Eyes: Anicteric  Neck: Supple, trachea midline  Lungs:  Bilateral rhonchi, normal effort  Heart: S1S2 no rubs  Abdomen:  +distended  Extremities: 1+ peripheral edema.  Neurologic: Nonfocal, moving all four extremities  Skin: Scaly skin b/l LE's, hyperemia b/l LE's       Basic Metabolic Panel:  Recent Labs Lab 02/12/17 0515 02/13/17 0520 02/13/17 1302 02/14/17 0627 02/14/17 1127 02/15/17 0616 02/16/17 0525  NA 137 144  --   --  142 143 144  K 4.0 3.8  --   --  3.9 3.9 4.4  CL 103 111  --   --  110 112* 115*  CO2 27 25  --   --  26 26 26   GLUCOSE 157* 116*  --   --  284* 246* 296*  BUN 78* 73*  --   --  63* 53* 46*  CREATININE 2.98* 2.71*  --  2.06* 2.01* 1.73* 1.49*  CALCIUM 8.7* 8.7*  --   --  8.5* 8.8* 9.0  PHOS  --   --  4.3  --   --   --   --     Liver Function Tests:  Recent Labs Lab 02/12/17 0515 02/15/17 0616  AST 18 19  ALT 13* 11*  ALKPHOS 116 83  BILITOT 1.3* 0.9  PROT 5.9* 5.6*  ALBUMIN 2.6* 3.0*   No results for input(s): LIPASE, AMYLASE in the last 168 hours.  Recent Labs Lab 02/13/17 0520 02/14/17 1127 02/16/17 0525  AMMONIA 68* 48* 60*     CBC:  Recent Labs Lab 02/12/17 0515 02/14/17 1127 02/15/17 0616 02/16/17 0525  WBC 3.8 3.3* 2.5* 3.2*  HGB 7.7* 8.3* 7.0* 8.0*  HCT 24.0* 25.8* 21.9* 24.8*  MCV 96.2 99.9 100.3* 100.1*  PLT 60* 69* 52* 49*    Cardiac Enzymes: No results for input(s): CKTOTAL, CKMB, CKMBINDEX, TROPONINI in the last 168 hours.  BNP: Invalid input(s): POCBNP  CBG:  Recent Labs Lab 02/16/17 0833 02/16/17 1241 02/16/17 1755 02/16/17 2104 02/17/17 0713  GLUCAP 293* 322* 271* 325* 76*    Microbiology: Results for orders placed or performed during the hospital encounter of 02/12/17  Blood culture (routine x 2)     Status: Abnormal   Collection Time: 02/12/17  5:17 AM  Result Value Ref Range Status   Specimen Description BLOOD R FA  Final   Special Requests   Final    BOTTLES DRAWN AEROBIC AND ANAEROBIC Blood Culture adequate volume   Culture  Setup Time   Final    GRAM POSITIVE COCCI IN BOTH AEROBIC AND ANAEROBIC BOTTLES CRITICAL RESULT CALLED TO, READ BACK BY AND VERIFIED WITH: JASON  ROBBINS AT 2037 02/12/2017 BY TFK MATT MCBANE @ 1610 ON 02/13/2017 BY CAF    Culture (A)  Final    METHICILLIN RESISTANT STAPHYLOCOCCUS AUREUS GROUP B STREP(S.AGALACTIAE)ISOLATED STAPHYLOCOCCUS EPIDERMIDIS STAPHYLOCOCCUS HAEMOLYTICUS THE SIGNIFICANCE OF ISOLATING THIS ORGANISM FROM A SINGLE SET OF BLOOD CULTURES WHEN MULTIPLE SETS ARE DRAWN IS UNCERTAIN. PLEASE NOTIFY THE MICROBIOLOGY DEPARTMENT WITHIN ONE WEEK IF SPECIATION AND SENSITIVITIES ARE REQUIRED. Performed at [facility], [place] [address] [place], [place] [address]    Report Status 02/17/2017 FINAL  Final   Organism ID, Bacteria GROUP B STREP(S.AGALACTIAE)ISOLATED  Final   Organism ID, Bacteria METHICILLIN RESISTANT STAPHYLOCOCCUS AUREUS  Final      Susceptibility   Group b strep(s.agalactiae)isolated - MIC*    CLINDAMYCIN <=0.25 SENSITIVE Sensitive     AMPICILLIN <=0.25 SENSITIVE Sensitive     ERYTHROMYCIN <=0.12 SENSITIVE  Sensitive     VANCOMYCIN 0.5 SENSITIVE Sensitive     CEFTRIAXONE <=0.12 SENSITIVE Sensitive     LEVOFLOXACIN 1 SENSITIVE Sensitive     * GROUP B STREP(S.AGALACTIAE)ISOLATED   Methicillin resistant staphylococcus aureus - MIC*    CIPROFLOXACIN >=8 RESISTANT Resistant     ERYTHROMYCIN >=8 RESISTANT Resistant     GENTAMICIN <=0.5 SENSITIVE Sensitive     OXACILLIN >=4 RESISTANT Resistant     TETRACYCLINE <=1 SENSITIVE Sensitive     VANCOMYCIN 1 SENSITIVE Sensitive     TRIMETH/SULFA >=320 RESISTANT Resistant     CLINDAMYCIN <=0.25 SENSITIVE Sensitive     RIFAMPIN <=0.5 SENSITIVE Sensitive     Inducible Clindamycin NEGATIVE Sensitive     * METHICILLIN RESISTANT STAPHYLOCOCCUS AUREUS  Blood culture (routine x 2)     Status: Abnormal   Collection Time: 02/12/17  5:17 AM  Result Value Ref Range Status   Specimen Description BLOOD R H  Final   Special Requests   Final    BOTTLES DRAWN AEROBIC AND ANAEROBIC Blood Culture adequate volume   Culture  Setup Time   Final    GRAM POSITIVE COCCI ANAEROBIC BOTTLE ONLY CRITICAL RESULT CALLED TO, READ BACK BY AND VERIFIED WITH: MATT MCBANE @ 4098 ON 02/13/2017 BY CAF    Culture (A)  Final    STAPHYLOCOCCUS CAPITIS THE SIGNIFICANCE OF ISOLATING THIS ORGANISM FROM A SINGLE SET OF BLOOD CULTURES WHEN MULTIPLE SETS ARE DRAWN IS UNCERTAIN. PLEASE NOTIFY THE MICROBIOLOGY DEPARTMENT WITHIN ONE WEEK IF SPECIATION AND SENSITIVITIES ARE REQUIRED. Performed at [facility], [place] [address], [place], [place] [address]    Report Status 02/15/2017 FINAL  Final  Urine culture     Status: Abnormal   Collection Time: 02/12/17  5:17 AM  Result Value Ref Range Status   Specimen Description URINE, RANDOM  Final   Special Requests NONE  Final   Culture MULTIPLE SPECIES PRESENT, SUGGEST RECOLLECTION (A)  Final   Report Status 02/13/2017 FINAL  Final  Blood Culture ID Panel (Reflexed)     Status: Abnormal   Collection Time: 02/12/17  5:17 AM  Result Value Ref  Range Status   Enterococcus species NOT DETECTED NOT DETECTED Final   Listeria monocytogenes NOT DETECTED NOT DETECTED Final   Staphylococcus species DETECTED (A) NOT DETECTED Final    Comment: CRITICAL RESULT CALLED TO, READ BACK BY AND VERIFIED WITH: JASON ROBBINS AT 2037 02/12/2017 BY TFK    Staphylococcus aureus DETECTED (A) NOT DETECTED Final    Comment: Methicillin (oxacillin)-resistant Staphylococcus aureus (MRSA). MRSA is predictably resistant to beta-lactam antibiotics (except ceftaroline). Preferred therapy is vancomycin unless  clinically contraindicated. Patient requires contact precautions if  hospitalized. CRITICAL RESULT CALLED TO, READ BACK BY AND VERIFIED WITH: JASON ROBBINS AT 2037 02/12/2017 BY TFK    Methicillin resistance DETECTED (A) NOT DETECTED Final    Comment: CRITICAL RESULT CALLED TO, READ BACK BY AND VERIFIED WITH: JASON ROBBINS AT 2037 02/12/2017 BY TFK    Streptococcus species DETECTED (A) NOT DETECTED Final    Comment: CRITICAL RESULT CALLED TO, READ BACK BY AND VERIFIED WITH: JASON ROBBINS AT 2037 02/12/2017 BY TFK    Streptococcus agalactiae DETECTED (A) NOT DETECTED Final    Comment: CRITICAL RESULT CALLED TO, READ BACK BY AND VERIFIED WITH: JASON ROBBINS AT 2037 02/12/2017 BY TFK    Streptococcus pneumoniae NOT DETECTED NOT DETECTED Final   Streptococcus pyogenes NOT DETECTED NOT DETECTED Final   Acinetobacter baumannii NOT DETECTED NOT DETECTED Final   Enterobacteriaceae species NOT DETECTED NOT DETECTED Final   Enterobacter cloacae complex NOT DETECTED NOT DETECTED Final   Escherichia coli NOT DETECTED NOT DETECTED Final   Klebsiella oxytoca NOT DETECTED NOT DETECTED Final   Klebsiella pneumoniae NOT DETECTED NOT DETECTED Final   Proteus species NOT DETECTED NOT DETECTED Final   Serratia marcescens NOT DETECTED NOT DETECTED Final   Haemophilus influenzae NOT DETECTED NOT DETECTED Final   Neisseria meningitidis NOT DETECTED NOT DETECTED Final    Pseudomonas aeruginosa NOT DETECTED NOT DETECTED Final   Candida albicans NOT DETECTED NOT DETECTED Final   Candida glabrata NOT DETECTED NOT DETECTED Final   Candida krusei NOT DETECTED NOT DETECTED Final   Candida parapsilosis NOT DETECTED NOT DETECTED Final   Candida tropicalis NOT DETECTED NOT DETECTED Final  MRSA PCR Screening     Status: None   Collection Time: 02/12/17  3:03 PM  Result Value Ref Range Status   MRSA by PCR NEGATIVE NEGATIVE Final    Comment:        The GeneXpert MRSA Assay (FDA approved for NASAL specimens only), is one component of a comprehensive MRSA colonization surveillance program. It is not intended to diagnose MRSA infection nor to guide or monitor treatment for MRSA infections.   Aerobic/Anaerobic Culture (surgical/deep wound)     Status: None   Collection Time: 02/13/17  3:00 PM  Result Value Ref Range Status   Specimen Description WOUND  Final   Special Requests NO ANAEROBIC SWAB SENT  Final   Culture   Final    SPECIMEN/CONTAINER TYPE INAPPROPRIATE FOR ORDERED TEST, UNABLE TO PERFORM NOTIFIED RN KRISTA AT 1142 02/15/17. RN TO REORDER AS AEROBIC CULTURE ONLY. Performed at [facility], [place] [address] [place], [place] [address]    Report Status 02/15/2017 FINAL  Final  Aerobic Culture (superficial specimen)     Status: None   Collection Time: 02/13/17  3:00 PM  Result Value Ref Range Status   Specimen Description LEG LEFT LOWER  Final   Special Requests SWAB  Final   Gram Stain   Final    FEW WBC PRESENT,BOTH PMN AND MONONUCLEAR NO ORGANISMS SEEN    Culture   Final    RARE METHICILLIN RESISTANT STAPHYLOCOCCUS AUREUS WITHIN MIXED CULTURE Performed at [facility], [place] [address], [place], [place] [address]    Report Status 02/16/2017 FINAL  Final   Organism ID, Bacteria METHICILLIN RESISTANT STAPHYLOCOCCUS AUREUS  Final      Susceptibility   Methicillin resistant staphylococcus aureus - MIC*    CIPROFLOXACIN >=8 RESISTANT  Resistant     ERYTHROMYCIN >=8 RESISTANT  Resistant     GENTAMICIN <=0.5 SENSITIVE Sensitive     OXACILLIN >=4 RESISTANT Resistant     TETRACYCLINE 2 SENSITIVE Sensitive     VANCOMYCIN 1 SENSITIVE Sensitive     TRIMETH/SULFA >=320 RESISTANT Resistant     CLINDAMYCIN >=8 RESISTANT Resistant     RIFAMPIN <=0.5 SENSITIVE Sensitive     Inducible Clindamycin NEGATIVE Sensitive     * RARE METHICILLIN RESISTANT STAPHYLOCOCCUS AUREUS  Culture, blood (single) w Reflex to ID Panel     Status: None (Preliminary result)   Collection Time: 02/13/17  5:28 PM  Result Value Ref Range Status   Specimen Description BLOOD L AC  Final   Special Requests BOTTLES DRAWN AEROBIC AND ANAEROBIC BCHV  Final   Culture NO GROWTH 4 DAYS  Final   Report Status PENDING  Incomplete    Coagulation Studies: No results for input(s): LABPROT, INR in the last 72 hours.  Urinalysis: No results for input(s): COLORURINE, LABSPEC, PHURINE, GLUCOSEU, HGBUR, BILIRUBINUR, KETONESUR, PROTEINUR, UROBILINOGEN, NITRITE, LEUKOCYTESUR in the last 72 hours.  Invalid input(s): APPERANCEUR    Imaging: US Abdomen Limited  Result Date: 02/16/2017 CLINICAL DATA:  Ascites and status post paracentesis on 02/13/2017. Further abdominal distention. EXAM: LIMITED ABDOMEN ULTRASOUND FOR ASCITES TECHNIQUE: Limited ultrasound survey for ascites was performed in all four abdominal quadrants. COMPARISON:  02/13/2017 FINDINGS: There is some scattered ascites throughout the peritoneal cavity. A large amount of fluid has not re-accumulated since paracentesis and repeat paracentesis was not performed today. IMPRESSION: Some ascites remains present in the peritoneal cavity. No significant reaccumulation since paracentesis 3 days ago. Electronically Signed   By: Aletta Edouard M.D.   On: 02/16/2017 16:09   US Venous Img Lower Bilateral  Result Date: 02/16/2017 CLINICAL DATA:  Lower extremity swelling x1 week EXAM: BILATERAL LOWER EXTREMITY VENOUS DOPPLER  ULTRASOUND TECHNIQUE: Gray-scale sonography with graded compression, as well as color Doppler and duplex ultrasound were performed to evaluate the lower extremity deep venous systems from the level of the common femoral vein and including the common femoral, femoral, profunda femoral, popliteal and calf veins including the posterior tibial, peroneal and gastrocnemius veins when visible. The superficial great saphenous vein was also interrogated. Spectral Doppler was utilized to evaluate flow at rest and with distal augmentation maneuvers in the common femoral, femoral and popliteal veins. COMPARISON:  None. FINDINGS: RIGHT LOWER EXTREMITY Common Femoral Vein: No evidence of thrombus. Normal compressibility, respiratory phasicity and response to augmentation. Saphenofemoral Junction: No evidence of thrombus. Normal compressibility and flow on color Doppler imaging. Profunda Femoral Vein: No evidence of thrombus. Normal compressibility and flow on color Doppler imaging. Femoral Vein: Nonocclusive echogenic thrombus from distal to proximal. Partial compressibility with demonstrable color Doppler and venous waveforms demonstrating respiratory phasicity. Popliteal Vein: Echogenic nonocclusive thrombus also noted in the popliteal vein with color Doppler flow and respiratory phasicity. The vein is partially compressible. Calf Veins: Echogenic nonocclusive thrombus in the posterior tibial vein with color Doppler flow demonstrated within. The peroneal veins are not well visualized and thrombus within is not entirely excluded. Superficial Great Saphenous Vein: No evidence of thrombus. Normal compressibility and flow on color Doppler imaging. Other Findings:  None. LEFT LOWER EXTREMITY Common Femoral Vein: Echogenic nonocclusive thrombus. Partial compressibility noted with respiratory phasicity and response to Valsalva. Saphenofemoral Junction: Echogenic thrombus, partially compressible and nonocclusive. Profunda Femoral Vein:  No evidence of thrombus. Normal compressibility and flow on color Doppler imaging. Femoral Vein: Echogenic thrombus from proximal to distal femoral vein. Partial compressibility,  respiratory phasicity and response to augmentation. Popliteal Vein: Echogenic thrombus without occlusion. Partial compressibility, respiratory phasicity and response to augmentation. Calf Veins: Compressible and patent posterior tibial vein with color Doppler demonstrated within. Suboptimal visualization of the peroneal veins unable to rule out nonocclusive thrombus. Other Findings:  None. IMPRESSION: Bilateral chronic appearing nonocclusive, partially compressible deep vein thrombosis of both lower extremities as above described. On the right these involve the femoral and popliteal veins as well as posterior tibial veins and on the left from common femoral through popliteal veins. The peroneal veins of the calves are not well visualized and thrombosis is not excluded. Critical Value/emergent results were called by telephone at the time of interpretation on 02/16/2017 at 6:50 pm to Dr. Verdell Carmine, who verbally acknowledged these results. Electronically Signed   By: Ashley Royalty M.D.   On: 02/16/2017 18:06     Medications:    . ammonium lactate   Topical BID  . cefTRIAXone (ROCEPHIN)  IV  1 g Intravenous Q24H  . ferrous sulfate  325 mg Oral BID WC  . insulin aspart  0-20 Units Subcutaneous TID WC  . insulin aspart  0-5 Units Subcutaneous QHS  . insulin glargine  15 Units Subcutaneous QHS  . lactulose  10 g Oral TID  . levothyroxine  200 mcg Oral [id number]  . multivitamin with minerals  1 tablet Oral Daily  . nystatin   Topical TID  . pantoprazole  40 mg Oral BID  . spironolactone  50 mg Oral Daily   haloperidol lactate, lactulose, ondansetron **OR** ondansetron (ZOFRAN) IV  Assessment/ Plan:  66 y.o.white male with Diabetes mellitus type 2, cirrhosis, hypothyroidism, ascites, nonalcoholic fatty liver disease, iron deficiency  anemia, chronic kidney disease stage III, who was admitted to [facility] on 02/12/2017 for evaluation of altered mental status.   1.  Acute renal failure on chronic kidney disease stage III: baseline creatinine of 1.9, GFR of 35 from 02/10/17 Acute renal failure secondary to prerenal azotemia from poor po intake, sepsis, and urinary tract infection  2. Diabetes mellitus type II with chronic kidney disease  - continue glucose control.   3.  Sepsis, with MRSA and strep. Spontanous bacterial peritonitis and urinary tract infection - ceftriaxone and vancomycin   4.  Anemia with chronic kidney disease: PRBC transfusion on 4/8  5.  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  - lactulose.    LOS: 5 Jeanelle Dake 4/10/201812:00 PM

## 2017-02-17 NOTE — Discharge Summary (Addendum)
Roberto Perez at [place] NAME: Roberto Perez    MR#:  [id number]  DATE OF BIRTH:  [birth year]  DATE OF ADMISSION:  02/12/2017   ADMITTING PHYSICIAN: Fritzi Mandes, MD  DATE OF DISCHARGE:  02/17/2017  PRIMARY CARE PHYSICIAN: ALDRIDGE,BARBARA, MD   ADMISSION DIAGNOSIS:   Encephalopathy [G93.40] Acute cystitis without hematuria [N30.00]  DISCHARGE DIAGNOSIS:   Active Problems:   Encephalopathy   Ascites   End stage liver disease ([place])   Palliative care encounter   DNR (do not resuscitate)   SECONDARY DIAGNOSIS:   Past Medical History:  Diagnosis Date  . Anemia     HOSPITAL COURSE:   MelvinElliottis a 66 y.o.malewith a known history of Severe iron deficiency anemia which is chronic has had several blood transfusions in the recent past most recent admission at [facility] from March 28 to April 3 for severe anemia where he ended up getting 7 units of blood transfusion. Patient has history of CK D stage III creatinine around 1.9, type 2 diabetes, nonalcoholic fatty liver disease/cirrhosis with portal hypertension and ascites and pancytopenia comes to the emergency room from [facility] with altered mental status.  1. Altered mental status/acute encephalopathy appears metabolic/hepatic/Sepsis -Ammonia of 163 on admission- decreased to 60 and patient is more alert and oriented - on lactulose-continue lactulose and titrated to have at least 3 soft bowel movements per day -improved mentation  2. Liver cirrhosis with ascites- -Patient has history of nonalcoholic fatty liver disease/cirrhosis of liver with complicatedascites, coagulopathy, portal hypertension, esophageal varices, pancytopenia -s/p Paracentesis ultrasound-guided therapeutic---fluid positive for 2900 WBC c/w SBP (already on IV Rocephin) -Received IV albumin in the hospital. Will finish off on IV Rocephin and will be changed to Levaquin for his SBP at discharge. After finishing  Levaquin he will need to be on ciprofloxacin daily for long-term prophylaxis -GI consultation appreciated  3. Severe iron deficiency anemia with significant chronic blood loss anemia -Patient's baseline hemoglobin is  At 7 -He recently had 7 units of blood transfusion last week at [facility] -Extensive workup has been done at [facility] in the form of EGD and colonoscopy which showed no variances however has history of varices status post banding in the past -Capsule endoscopy showed multiple mucosal defects throughout the entire bowel -We'll continue Protonix daily. -Continue oral iron - Received 1 unit packed RBC transfusion here  4. Acute on chronic renal failure -Multi-factorial appears hepatorenal syndrome with poor intravascular volume along with history of diabetes -Nephrology consultation noted.  -Baseline creatinine 1.7-1.9. Restart Aldactone at discharge -Lasix can be added as outpatient  5. MRSA/Strep Sepsis with UTI also has left heel ulcer. Cultures growing different organisms. Partly contamination 2. -Received IV Rocephin and IV vancomycin in the hospital -urine cultures shows multiple species -ID consulted. ECHO with no vegetations. Repeat blood cultures ordered and negative so far -Will be discharged on doxycycline for 10 days and Levaquin for 5 more days for SBP and followed by ciprofloxacin daily for prophylaxis  6. Bilateral DVT-patient has known history of bilateral DVT. 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 Patient is not a candidate for anticoagulation at all due to his bleeding-transfusion dependent. Discussed with vascular about IVC filter, they have recommended that since this is chronic clot and not acute, would not recommend IVC at this time. If possible, if he can tolerate-aspirin is recommended. But due to his GI bleed, will hold off at this time and outpatient follow-up recommended.  7.  Type 2 diabetes -continue  his Levemir at discharge  8. Patient carries a very poor prognosis given his multitude of medical problems . Palliative care consultation.pt is now DNR  9. Dysphagia-had some coughing while eating. Partly from his encephalopathy 2. -Speech therapy recommended dysphagia 3-mechanical soft diet with nectar thick liquids at this time.   DISCHARGE CONDITIONS:   Guarded  CONSULTS OBTAINED:   Treatment Team:  [name], MD Munsoor Holley Raring, MD Leonel Ramsay, MD  DRUG ALLERGIES:   No Known Allergies DISCHARGE MEDICATIONS:   Allergies as of 02/17/2017   No Known Allergies     Medication List    STOP taking these medications   allopurinol 300 MG tablet Commonly known as:  ZYLOPRIM   glipiZIDE 10 MG 24 hr tablet Commonly known as:  GLUCOTROL XL     TAKE these medications   ammonium lactate 12 % lotion Commonly known as:  LAC-HYDRIN Apply topically 2 (two) times daily.   carvedilol 3.125 MG tablet Commonly known as:  COREG Take 3.125 mg by mouth 2 (two) times daily.   ciprofloxacin 500 MG tablet Commonly known as:  CIPRO Take 1 tablet (500 mg total) by mouth daily. To be started after finishing the levaquin - from 02/23/17   doxycycline 100 MG capsule Commonly known as:  VIBRAMYCIN Take 1 capsule (100 mg total) by mouth 2 (two) times daily. X 10 days   ferrous sulfate 325 (65 FE) MG tablet Take 1 tablet (325 mg total) by mouth 2 (two) times daily with a meal.   lactulose 10 GM/15ML solution Commonly known as:  CHRONULAC Take 30 mLs (20 g total) by mouth 3 (three) times daily. What changed:  how much to take   LEVEMIR 100 UNIT/ML injection Generic drug:  insulin detemir Inject 20 Units into the skin daily.   levofloxacin 500 MG tablet Commonly known as:  LEVAQUIN Take 1 tablet (500 mg total) by mouth daily. X 5 days   levothyroxine 200 MCG tablet Commonly known as:  SYNTHROID, LEVOTHROID Take 200 mcg by mouth daily.   pantoprazole 40 MG  tablet Commonly known as:  PROTONIX Take 40 mg by mouth 2 (two) times daily.   spironolactone 100 MG tablet Commonly known as:  ALDACTONE Take 50 mg by mouth daily.        DISCHARGE INSTRUCTIONS:   1. PCP follow-up in 1-2 weeks. Will need blood work at that visit 2. Follow up at the wound care clinic for the left heel ulcer 3. Palliative care to follow up at rehab  DIET:   Cardiac diet  ACTIVITY:   Activity as tolerated  OXYGEN:   Home Oxygen: No.  Oxygen Delivery: room air  DISCHARGE LOCATION:   nursing home   If you experience worsening of your admission symptoms, develop shortness of breath, life threatening emergency, suicidal or homicidal thoughts you must seek medical attention immediately by calling 911 or calling your MD immediately  if symptoms less severe.  You Must read complete instructions/literature along with all the possible adverse reactions/side effects for all the Medicines you take and that have been prescribed to you. Take any new Medicines after you have completely understood and accpet all the possible adverse reactions/side effects.   Please note  You were cared for by a hospitalist during your hospital stay. If you have any questions about your discharge medications or the care you received while you were in the hospital after you are discharged, you can call the unit and asked  to speak with the hospitalist on call if the hospitalist that took care of you is not available. Once you are discharged, your primary care physician will handle any further medical issues. Please note that NO REFILLS for any discharge medications will be authorized once you are discharged, as it is imperative that you return to your primary care physician (or establish a relationship with a primary care physician if you do not have one) for your aftercare needs so that they can reassess your need for medications and monitor your lab values.    On the day of Discharge:   VITAL SIGNS:   Blood pressure (!) 124/50, pulse 72, temperature 98.6 F (37 C), temperature source Oral, resp. rate 17, height 6\' 2"  (1.88 m), weight 117.5 kg (259 lb 1.6 oz), SpO2 96 %.  PHYSICAL EXAMINATION:   GENERAL:  66 y.o.-year-old patient lying in the bed with no acute distress.  EYES: Pupils equal, round, reactive to light and accommodation. No scleral icterus. Extraocular muscles intact.  HEENT: Head atraumatic, normocephalic. Oropharynx and nasopharynx clear.  NECK:  Supple, no jugular venous distention. No thyroid enlargement, no tenderness.  LUNGS: Normal breath sounds bilaterally, no wheezing, rales,rhonchi or crepitation. No use of accessory muscles of respiration.  CARDIOVASCULAR: S1, S2 normal. No murmurs, rubs, or gallops.  ABDOMEN: Soft, distended, nontender. Bowel sounds present. No organomegaly or mass.  EXTREMITIES: No cyanosis, or clubbing. 1+ pedal edema, positive for mild asterexis NEUROLOGIC: Cranial nerves II through XII are intact. Muscle strength 5/5 in all extremities. Sensation intact. Gait not checked. Global weakness noted. PSYCHIATRIC: The patient is alert and oriented x 3.  SKIN: No obvious rash, lesion, or ulcer.   DATA REVIEW:   CBC  Recent Labs Lab 02/16/17 0525  WBC 3.2*  HGB 8.0*  HCT 24.8*  PLT 49*    Chemistries   Recent Labs Lab 02/15/17 0616 02/16/17 0525  NA 143 144  K 3.9 4.4  CL 112* 115*  CO2 26 26  GLUCOSE 246* 296*  BUN 53* 46*  CREATININE 1.73* 1.49*  CALCIUM 8.8* 9.0  AST 19  --   ALT 11*  --   ALKPHOS 83  --   BILITOT 0.9  --      Microbiology Results  Results for orders placed or performed during the hospital encounter of 02/12/17  Blood culture (routine x 2)     Status: Abnormal (Preliminary result)   Collection Time: 02/12/17  5:17 AM  Result Value Ref Range Status   Specimen Description BLOOD R FA  Final   Special Requests   Final    BOTTLES DRAWN AEROBIC AND ANAEROBIC Blood Culture adequate volume    Culture  Setup Time   Final    GRAM POSITIVE COCCI IN BOTH AEROBIC AND ANAEROBIC BOTTLES CRITICAL RESULT CALLED TO, READ BACK BY AND VERIFIED WITH: JASON ROBBINS AT 2037 02/12/2017 BY TFK MATT MCBANE @ 6712 ON 02/13/2017 BY CAF    Culture (A)  Final    STAPHYLOCOCCUS AUREUS GROUP B STREP(S.AGALACTIAE)ISOLATED SUSCEPTIBILITIES TO FOLLOW STAPHYLOCOCCUS EPIDERMIDIS STAPHYLOCOCCUS HAEMOLYTICUS THE SIGNIFICANCE OF ISOLATING THIS ORGANISM FROM A SINGLE SET OF BLOOD CULTURES WHEN MULTIPLE SETS ARE DRAWN IS UNCERTAIN. PLEASE NOTIFY THE MICROBIOLOGY DEPARTMENT WITHIN ONE WEEK IF SPECIATION AND SENSITIVITIES ARE REQUIRED.    Report Status PENDING  Incomplete   Organism ID, Bacteria GROUP B STREP(S.AGALACTIAE)ISOLATED  Final      Susceptibility   Group b strep(s.agalactiae)isolated - MIC*    CLINDAMYCIN <=0.25 SENSITIVE Sensitive  AMPICILLIN <=0.25 SENSITIVE Sensitive     ERYTHROMYCIN <=0.12 SENSITIVE Sensitive     VANCOMYCIN 0.5 SENSITIVE Sensitive     CEFTRIAXONE <=0.12 SENSITIVE Sensitive     LEVOFLOXACIN Value in next row Sensitive      1 SENSITIVEPerformed at [facility] [address], [place], [place] [address]    * GROUP B STREP(S.AGALACTIAE)ISOLATED  Blood culture (routine x 2)     Status: Abnormal   Collection Time: 02/12/17  5:17 AM  Result Value Ref Range Status   Specimen Description BLOOD R H  Final   Special Requests   Final    BOTTLES DRAWN AEROBIC AND ANAEROBIC Blood Culture adequate volume   Culture  Setup Time   Final    GRAM POSITIVE COCCI ANAEROBIC BOTTLE ONLY CRITICAL RESULT CALLED TO, READ BACK BY AND VERIFIED WITH: MATT MCBANE @ 0100 ON 02/13/2017 BY CAF    Culture (A)  Final    STAPHYLOCOCCUS CAPITIS THE SIGNIFICANCE OF ISOLATING THIS ORGANISM FROM A SINGLE SET OF BLOOD CULTURES WHEN MULTIPLE SETS ARE DRAWN IS UNCERTAIN. PLEASE NOTIFY THE MICROBIOLOGY DEPARTMENT WITHIN ONE WEEK IF SPECIATION AND SENSITIVITIES ARE REQUIRED. Performed at [facility], [place] [address] [place], [place] [address]    Report Status 02/15/2017 FINAL  Final  Urine culture     Status: Abnormal   Collection Time: 02/12/17  5:17 AM  Result Value Ref Range Status   Specimen Description URINE, RANDOM  Final   Special Requests NONE  Final   Culture MULTIPLE SPECIES PRESENT, SUGGEST RECOLLECTION (A)  Final   Report Status 02/13/2017 FINAL  Final  Blood Culture ID Panel (Reflexed)     Status: Abnormal   Collection Time: 02/12/17  5:17 AM  Result Value Ref Range Status   Enterococcus species NOT DETECTED NOT DETECTED Final   Listeria monocytogenes NOT DETECTED NOT DETECTED Final   Staphylococcus species DETECTED (A) NOT DETECTED Final    Comment: CRITICAL RESULT CALLED TO, READ BACK BY AND VERIFIED WITH: JASON ROBBINS AT 2037 02/12/2017 BY TFK    Staphylococcus aureus DETECTED (A) NOT DETECTED Final    Comment: Methicillin (oxacillin)-resistant Staphylococcus aureus (MRSA). MRSA is predictably resistant to beta-lactam antibiotics (except ceftaroline). Preferred therapy is vancomycin unless clinically contraindicated. Patient requires contact precautions if  hospitalized. CRITICAL RESULT CALLED TO, READ BACK BY AND VERIFIED WITH: JASON ROBBINS AT 2037 02/12/2017 BY TFK    Methicillin resistance DETECTED (A) NOT DETECTED Final    Comment: CRITICAL RESULT CALLED TO, READ BACK BY AND VERIFIED WITH: JASON ROBBINS AT 2037 02/12/2017 BY TFK    Streptococcus species DETECTED (A) NOT DETECTED Final    Comment: CRITICAL RESULT CALLED TO, READ BACK BY AND VERIFIED WITH: JASON ROBBINS AT 2037 02/12/2017 BY TFK    Streptococcus agalactiae DETECTED (A) NOT DETECTED Final    Comment: CRITICAL RESULT CALLED TO, READ BACK BY AND VERIFIED WITH: JASON ROBBINS AT 2037 02/12/2017 BY TFK    Streptococcus pneumoniae NOT DETECTED NOT DETECTED Final   Streptococcus pyogenes NOT DETECTED NOT DETECTED Final   Acinetobacter baumannii NOT DETECTED NOT DETECTED Final    Enterobacteriaceae species NOT DETECTED NOT DETECTED Final   Enterobacter cloacae complex NOT DETECTED NOT DETECTED Final   Escherichia coli NOT DETECTED NOT DETECTED Final   Klebsiella oxytoca NOT DETECTED NOT DETECTED Final   Klebsiella pneumoniae NOT DETECTED NOT DETECTED Final   Proteus species NOT DETECTED NOT DETECTED Final   Serratia marcescens NOT DETECTED NOT DETECTED Final  Haemophilus influenzae NOT DETECTED NOT DETECTED Final   Neisseria meningitidis NOT DETECTED NOT DETECTED Final   Pseudomonas aeruginosa NOT DETECTED NOT DETECTED Final   Candida albicans NOT DETECTED NOT DETECTED Final   Candida glabrata NOT DETECTED NOT DETECTED Final   Candida krusei NOT DETECTED NOT DETECTED Final   Candida parapsilosis NOT DETECTED NOT DETECTED Final   Candida tropicalis NOT DETECTED NOT DETECTED Final  MRSA PCR Screening     Status: None   Collection Time: 02/12/17  3:03 PM  Result Value Ref Range Status   MRSA by PCR NEGATIVE NEGATIVE Final    Comment:        The GeneXpert MRSA Assay (FDA approved for NASAL specimens only), is one component of a comprehensive MRSA colonization surveillance program. It is not intended to diagnose MRSA infection nor to guide or monitor treatment for MRSA infections.   Aerobic/Anaerobic Culture (surgical/deep wound)     Status: None   Collection Time: 02/13/17  3:00 PM  Result Value Ref Range Status   Specimen Description WOUND  Final   Special Requests NO ANAEROBIC SWAB SENT  Final   Culture   Final    SPECIMEN/CONTAINER TYPE INAPPROPRIATE FOR ORDERED TEST, UNABLE TO PERFORM NOTIFIED RN KRISTA AT 1142 02/15/17. RN TO REORDER AS AEROBIC CULTURE ONLY. Performed at [facility], [place] [address], [place], [place] [address]    Report Status 02/15/2017 FINAL  Final  Aerobic Culture (superficial specimen)     Status: None   Collection Time: 02/13/17  3:00 PM  Result Value Ref Range Status   Specimen Description LEG LEFT LOWER  Final    Special Requests SWAB  Final   Gram Stain   Final    FEW WBC PRESENT,BOTH PMN AND MONONUCLEAR NO ORGANISMS SEEN    Culture   Final    RARE METHICILLIN RESISTANT STAPHYLOCOCCUS AUREUS WITHIN MIXED CULTURE Performed at [facility], [place] [address], [place], [place] [address]    Report Status 02/16/2017 FINAL  Final   Organism ID, Bacteria METHICILLIN RESISTANT STAPHYLOCOCCUS AUREUS  Final      Susceptibility   Methicillin resistant staphylococcus aureus - MIC*    CIPROFLOXACIN >=8 RESISTANT Resistant     ERYTHROMYCIN >=8 RESISTANT Resistant     GENTAMICIN <=0.5 SENSITIVE Sensitive     OXACILLIN >=4 RESISTANT Resistant     TETRACYCLINE 2 SENSITIVE Sensitive     VANCOMYCIN 1 SENSITIVE Sensitive     TRIMETH/SULFA >=320 RESISTANT Resistant     CLINDAMYCIN >=8 RESISTANT Resistant     RIFAMPIN <=0.5 SENSITIVE Sensitive     Inducible Clindamycin NEGATIVE Sensitive     * RARE METHICILLIN RESISTANT STAPHYLOCOCCUS AUREUS  Culture, blood (single) w Reflex to ID Panel     Status: None (Preliminary result)   Collection Time: 02/13/17  5:28 PM  Result Value Ref Range Status   Specimen Description BLOOD L AC  Final   Special Requests BOTTLES DRAWN AEROBIC AND ANAEROBIC [place]  Final   Culture NO GROWTH 3 DAYS  Final   Report Status PENDING  Incomplete    RADIOLOGY:  US Abdomen Limited  Result Date: 02/16/2017 CLINICAL DATA:  Ascites and status post paracentesis on 02/13/2017. Further abdominal distention. EXAM: LIMITED ABDOMEN ULTRASOUND FOR ASCITES TECHNIQUE: Limited ultrasound survey for ascites was performed in all four abdominal quadrants. COMPARISON:  02/13/2017 FINDINGS: There is some scattered ascites throughout the peritoneal cavity. A large amount of fluid has not re-accumulated since paracentesis and repeat paracentesis was  not performed today. IMPRESSION: Some ascites remains present in the peritoneal cavity. No significant reaccumulation since paracentesis 3 days ago.  Electronically Signed   By: Aletta Edouard M.D.   On: 02/16/2017 16:09   US Venous Img Lower Bilateral  Result Date: 02/16/2017 CLINICAL DATA:  Lower extremity swelling x1 week EXAM: BILATERAL LOWER EXTREMITY VENOUS DOPPLER ULTRASOUND TECHNIQUE: Gray-scale sonography with graded compression, as well as color Doppler and duplex ultrasound were performed to evaluate the lower extremity deep venous systems from the level of the common femoral vein and including the common femoral, femoral, profunda femoral, popliteal and calf veins including the posterior tibial, peroneal and gastrocnemius veins when visible. The superficial great saphenous vein was also interrogated. Spectral Doppler was utilized to evaluate flow at rest and with distal augmentation maneuvers in the common femoral, femoral and popliteal veins. COMPARISON:  None. FINDINGS: RIGHT LOWER EXTREMITY Common Femoral Vein: No evidence of thrombus. Normal compressibility, respiratory phasicity and response to augmentation. Saphenofemoral Junction: No evidence of thrombus. Normal compressibility and flow on color Doppler imaging. Profunda Femoral Vein: No evidence of thrombus. Normal compressibility and flow on color Doppler imaging. Femoral Vein: Nonocclusive echogenic thrombus from distal to proximal. Partial compressibility with demonstrable color Doppler and venous waveforms demonstrating respiratory phasicity. Popliteal Vein: Echogenic nonocclusive thrombus also noted in the popliteal vein with color Doppler flow and respiratory phasicity. The vein is partially compressible. Calf Veins: Echogenic nonocclusive thrombus in the posterior tibial vein with color Doppler flow demonstrated within. The peroneal veins are not well visualized and thrombus within is not entirely excluded. Superficial Great Saphenous Vein: No evidence of thrombus. Normal compressibility and flow on color Doppler imaging. Other Findings:  None. LEFT LOWER EXTREMITY Common Femoral  Vein: Echogenic nonocclusive thrombus. Partial compressibility noted with respiratory phasicity and response to Valsalva. Saphenofemoral Junction: Echogenic thrombus, partially compressible and nonocclusive. Profunda Femoral Vein: No evidence of thrombus. Normal compressibility and flow on color Doppler imaging. Femoral Vein: Echogenic thrombus from proximal to distal femoral vein. Partial compressibility, respiratory phasicity and response to augmentation. Popliteal Vein: Echogenic thrombus without occlusion. Partial compressibility, respiratory phasicity and response to augmentation. Calf Veins: Compressible and patent posterior tibial vein with color Doppler demonstrated within. Suboptimal visualization of the peroneal veins unable to rule out nonocclusive thrombus. Other Findings:  None. IMPRESSION: Bilateral chronic appearing nonocclusive, partially compressible deep vein thrombosis of both lower extremities as above described. On the right these involve the femoral and popliteal veins as well as posterior tibial veins and on the left from common femoral through popliteal veins. The peroneal veins of the calves are not well visualized and thrombosis is not excluded. Critical Value/emergent results were called by telephone at the time of interpretation on 02/16/2017 at 6:50 pm to Dr. Verdell Carmine, who verbally acknowledged these results. Electronically Signed   By: Ashley Royalty M.D.   On: 02/16/2017 18:06     Management plans discussed with the patient, family and they are in agreement.  CODE STATUS:     Code Status Orders        Start     Ordered   02/12/17 1508  Do not attempt resuscitation (DNR)  Continuous    Question Answer Comment  In the event of cardiac or respiratory ARREST Do not call a "code blue"   In the event of cardiac or respiratory ARREST Do not perform Intubation, CPR, defibrillation or ACLS   In the event of cardiac or respiratory ARREST Use medication by any route, position, wound  care,  and other measures to relive pain and suffering. May use oxygen, suction and manual treatment of airway obstruction as needed for comfort.      02/12/17 1507    Code Status History    Date Active Date Inactive Code Status Order ID Comments User Context   02/12/2017 12:02 PM 02/12/2017  3:07 PM Full Code [id number]  Fritzi Mandes, MD Inpatient      TOTAL TIME TAKING CARE OF THIS PATIENT: 37 minutes.    Gladstone Lighter M.D on 02/17/2017 at 8:10 AM  Between 7am to 6pm - Pager - [phone]  After 6pm go to [email or web address] - Proofreader  [facility]  Office  [phone]  CC: Primary care physician; Gayland Curry, MD   Note: This dictation was prepared with Dragon dictation along with smaller phrase technology. Any transcriptional errors that result from this process are unintentional.

## 2017-02-17 NOTE — Clinical Social Work Note (Signed)
Pt is ready for discharge today and will go to [facility]. Pt is aware and agreeable to discharge plan. CSW updated pt's brother who is also aware and agreeable to discharge plan. [employer] Kaufmann has been obtained (#[phone]). RN will call report. [facility] will provide transportation. CSW is signing off as no further needs identified.   Darden Dates, MSW, LCSW Clinical Social Worker  [phone]

## 2017-02-17 NOTE — Progress Notes (Signed)
MD making rounds. Order received to discharge to facility. IV's removed. CSW facilitating transfer to [facility]. Report called to Ashok Norris, Therapist, sports. EMS called for transport. Awaiting EMS for transport.

## 2017-02-18 LAB — TYPE AND SCREEN
ABO/RH(D): O NEG
Antibody Screen: POSITIVE
Donor AG Type: NEGATIVE
UNIT DIVISION: 0
Unit division: 0
Unit division: 0

## 2017-02-18 LAB — CULTURE, BLOOD (SINGLE): CULTURE: NO GROWTH

## 2017-02-18 LAB — BPAM RBC
BLOOD PRODUCT EXPIRATION DATE: 201805012359
Blood Product Expiration Date: 201805052359
Blood Product Expiration Date: 201805112359
ISSUE DATE / TIME: 201804082130
UNIT TYPE AND RH: 9500
UNIT TYPE AND RH: 9500
UNIT TYPE AND RH: 9500

## 2017-03-03 DIAGNOSIS — E11622 Type 2 diabetes mellitus with other skin ulcer: Secondary | ICD-10-CM | POA: Diagnosis not present

## 2017-03-03 DIAGNOSIS — I872 Venous insufficiency (chronic) (peripheral): Secondary | ICD-10-CM | POA: Diagnosis not present

## 2017-03-03 DIAGNOSIS — K7460 Unspecified cirrhosis of liver: Secondary | ICD-10-CM | POA: Diagnosis not present

## 2017-03-03 DIAGNOSIS — I890 Lymphedema, not elsewhere classified: Secondary | ICD-10-CM | POA: Diagnosis not present

## 2017-03-04 ENCOUNTER — Encounter: Payer: Self-pay | Admitting: Emergency Medicine

## 2017-03-04 ENCOUNTER — Ambulatory Visit (HOSPITAL_COMMUNITY)
Admission: AD | Admit: 2017-03-04 | Discharge: 2017-03-04 | Disposition: A | Discharge status: Home / Self Care | Payer: PPO | Source: Other Acute Inpatient Hospital | Attending: Emergency Medicine | Admitting: Emergency Medicine

## 2017-03-04 ENCOUNTER — Other Ambulatory Visit: Payer: Self-pay | Admitting: Family Medicine

## 2017-03-04 ENCOUNTER — Emergency Department
Admission: EM | Admit: 2017-03-04 | Discharge: 2017-03-04 | Payer: PPO | Attending: Emergency Medicine | Admitting: Emergency Medicine

## 2017-03-04 DIAGNOSIS — K7290 Hepatic failure, unspecified without coma: Secondary | ICD-10-CM | POA: Diagnosis not present

## 2017-03-04 DIAGNOSIS — I890 Lymphedema, not elsewhere classified: Secondary | ICD-10-CM | POA: Diagnosis not present

## 2017-03-04 DIAGNOSIS — D649 Anemia, unspecified: Secondary | ICD-10-CM | POA: Insufficient documentation

## 2017-03-04 DIAGNOSIS — Z87891 Personal history of nicotine dependence: Secondary | ICD-10-CM | POA: Insufficient documentation

## 2017-03-04 DIAGNOSIS — Z794 Long term (current) use of insulin: Secondary | ICD-10-CM | POA: Insufficient documentation

## 2017-03-04 DIAGNOSIS — R918 Other nonspecific abnormal finding of lung field: Secondary | ICD-10-CM | POA: Diagnosis not present

## 2017-03-04 DIAGNOSIS — J90 Pleural effusion, not elsewhere classified: Secondary | ICD-10-CM | POA: Diagnosis not present

## 2017-03-04 DIAGNOSIS — N390 Urinary tract infection, site not specified: Secondary | ICD-10-CM | POA: Diagnosis not present

## 2017-03-04 DIAGNOSIS — R001 Bradycardia, unspecified: Secondary | ICD-10-CM

## 2017-03-04 DIAGNOSIS — D500 Iron deficiency anemia secondary to blood loss (chronic): Secondary | ICD-10-CM | POA: Insufficient documentation

## 2017-03-04 DIAGNOSIS — K766 Portal hypertension: Secondary | ICD-10-CM | POA: Diagnosis not present

## 2017-03-04 DIAGNOSIS — I739 Peripheral vascular disease, unspecified: Secondary | ICD-10-CM | POA: Diagnosis not present

## 2017-03-04 DIAGNOSIS — L97209 Non-pressure chronic ulcer of unspecified calf with unspecified severity: Secondary | ICD-10-CM | POA: Diagnosis not present

## 2017-03-04 DIAGNOSIS — R188 Other ascites: Secondary | ICD-10-CM | POA: Diagnosis not present

## 2017-03-04 DIAGNOSIS — I440 Atrioventricular block, first degree: Secondary | ICD-10-CM | POA: Diagnosis not present

## 2017-03-04 DIAGNOSIS — K922 Gastrointestinal hemorrhage, unspecified: Secondary | ICD-10-CM | POA: Insufficient documentation

## 2017-03-04 DIAGNOSIS — K5730 Diverticulosis of large intestine without perforation or abscess without bleeding: Secondary | ICD-10-CM | POA: Diagnosis not present

## 2017-03-04 DIAGNOSIS — G8929 Other chronic pain: Secondary | ICD-10-CM | POA: Diagnosis not present

## 2017-03-04 DIAGNOSIS — E119 Type 2 diabetes mellitus without complications: Secondary | ICD-10-CM | POA: Diagnosis not present

## 2017-03-04 DIAGNOSIS — I8393 Asymptomatic varicose veins of bilateral lower extremities: Secondary | ICD-10-CM | POA: Diagnosis not present

## 2017-03-04 DIAGNOSIS — I4510 Unspecified right bundle-branch block: Secondary | ICD-10-CM | POA: Diagnosis not present

## 2017-03-04 DIAGNOSIS — L89319 Pressure ulcer of right buttock, unspecified stage: Secondary | ICD-10-CM | POA: Diagnosis not present

## 2017-03-04 DIAGNOSIS — D62 Acute posthemorrhagic anemia: Secondary | ICD-10-CM | POA: Diagnosis not present

## 2017-03-04 DIAGNOSIS — I872 Venous insufficiency (chronic) (peripheral): Secondary | ICD-10-CM | POA: Diagnosis not present

## 2017-03-04 DIAGNOSIS — Z6831 Body mass index (BMI) 31.0-31.9, adult: Secondary | ICD-10-CM | POA: Diagnosis not present

## 2017-03-04 DIAGNOSIS — L97919 Non-pressure chronic ulcer of unspecified part of right lower leg with unspecified severity: Secondary | ICD-10-CM | POA: Diagnosis not present

## 2017-03-04 DIAGNOSIS — L89139 Pressure ulcer of right lower back, unspecified stage: Secondary | ICD-10-CM | POA: Diagnosis not present

## 2017-03-04 DIAGNOSIS — R7989 Other specified abnormal findings of blood chemistry: Secondary | ICD-10-CM | POA: Diagnosis not present

## 2017-03-04 DIAGNOSIS — K920 Hematemesis: Secondary | ICD-10-CM | POA: Diagnosis not present

## 2017-03-04 DIAGNOSIS — R161 Splenomegaly, not elsewhere classified: Secondary | ICD-10-CM | POA: Diagnosis not present

## 2017-03-04 DIAGNOSIS — E1122 Type 2 diabetes mellitus with diabetic chronic kidney disease: Secondary | ICD-10-CM | POA: Diagnosis not present

## 2017-03-04 DIAGNOSIS — J8410 Pulmonary fibrosis, unspecified: Secondary | ICD-10-CM | POA: Diagnosis not present

## 2017-03-04 DIAGNOSIS — R1312 Dysphagia, oropharyngeal phase: Secondary | ICD-10-CM

## 2017-03-04 HISTORY — DX: Gastrointestinal hemorrhage, unspecified: K92.2

## 2017-03-04 HISTORY — DX: Dysphagia, unspecified: R13.10

## 2017-03-04 HISTORY — DX: Venous insufficiency (chronic) (peripheral): I87.2

## 2017-03-04 HISTORY — DX: Cellulitis, unspecified: L03.90

## 2017-03-04 HISTORY — DX: Disorder of thyroid, unspecified: E07.9

## 2017-03-04 HISTORY — DX: Gout, unspecified: M10.9

## 2017-03-04 HISTORY — DX: Lymphedema, not elsewhere classified: I89.0

## 2017-03-04 HISTORY — DX: Phlebitis and thrombophlebitis of unspecified popliteal vein: I80.229

## 2017-03-04 HISTORY — DX: Type 2 diabetes mellitus without complications: E11.9

## 2017-03-04 HISTORY — DX: Dyspnea, unspecified: R06.00

## 2017-03-04 HISTORY — DX: Other forms of angina pectoris: I20.8

## 2017-03-04 LAB — GLUCOSE, CAPILLARY: GLUCOSE-CAPILLARY: 122 mg/dL — AB (ref 65–99)

## 2017-03-04 LAB — CBC WITH DIFFERENTIAL/PLATELET
Basophils Absolute: 0 10*3/uL (ref 0–0.1)
Basophils Relative: 0 %
EOS ABS: 0.2 10*3/uL (ref 0–0.7)
Eosinophils Relative: 11 %
HEMATOCRIT: 21.4 % — AB (ref 40.0–52.0)
HEMOGLOBIN: 6.8 g/dL — AB (ref 13.0–18.0)
Lymphocytes Relative: 17 %
Lymphs Abs: 0.4 10*3/uL — ABNORMAL LOW (ref 1.0–3.6)
MCH: 33.3 pg (ref 26.0–34.0)
MCHC: 32.1 g/dL (ref 32.0–36.0)
MCV: 103.8 fL — AB (ref 80.0–100.0)
MONOS PCT: 11 %
Monocytes Absolute: 0.2 10*3/uL (ref 0.2–1.0)
NEUTROS PCT: 61 %
Neutro Abs: 1.4 10*3/uL (ref 1.4–6.5)
Platelets: 66 10*3/uL — ABNORMAL LOW (ref 150–440)
RBC: 2.06 MIL/uL — AB (ref 4.40–5.90)
RDW: 24.7 % — ABNORMAL HIGH (ref 11.5–14.5)
WBC: 2.3 10*3/uL — ABNORMAL LOW (ref 3.8–10.6)

## 2017-03-04 LAB — BASIC METABOLIC PANEL
Anion gap: 7 (ref 5–15)
BUN: 35 mg/dL — AB (ref 6–20)
CALCIUM: 8.7 mg/dL — AB (ref 8.9–10.3)
CO2: 25 mmol/L (ref 22–32)
CREATININE: 1.81 mg/dL — AB (ref 0.61–1.24)
Chloride: 112 mmol/L — ABNORMAL HIGH (ref 101–111)
GFR calc non Af Amer: 37 mL/min — ABNORMAL LOW (ref >60)
GFR, EST AFRICAN AMERICAN: 43 mL/min — AB (ref >60)
GLUCOSE: 199 mg/dL — AB (ref 65–99)
Potassium: 5 mmol/L (ref 3.5–5.1)
Sodium: 144 mmol/L (ref 135–145)

## 2017-03-04 LAB — HEPATIC FUNCTION PANEL
ALBUMIN: 2.8 g/dL — AB (ref 3.5–5.0)
ALT: 17 U/L (ref 17–63)
AST: 27 U/L (ref 15–41)
Alkaline Phosphatase: 184 U/L — ABNORMAL HIGH (ref 38–126)
Bilirubin, Direct: 0.1 mg/dL (ref 0.1–0.5)
Indirect Bilirubin: 0.5 mg/dL (ref 0.3–0.9)
TOTAL PROTEIN: 5.7 g/dL — AB (ref 6.5–8.1)
Total Bilirubin: 0.6 mg/dL (ref 0.3–1.2)

## 2017-03-04 LAB — PROTIME-INR
INR: 1.57
PROTHROMBIN TIME: 18.9 s — AB (ref 11.4–15.2)

## 2017-03-04 LAB — APTT: aPTT: 43 seconds — ABNORMAL HIGH (ref 24–36)

## 2017-03-04 LAB — PREPARE RBC (CROSSMATCH)

## 2017-03-04 MED ORDER — SODIUM CHLORIDE 0.9 % IV SOLN: 10.0000 mL/h | Freq: Once | INTRAVENOUS | Status: DC

## 2017-03-04 MED ORDER — SODIUM CHLORIDE 0.9 % IV BOLUS (SEPSIS)
1000.0000 mL | Freq: Once | INTRAVENOUS | Status: AC
  Administered 2017-03-04: 1000 mL via INTRAVENOUS

## 2017-03-04 MED ORDER — PANTOPRAZOLE SODIUM 40 MG IV SOLR: 40.0000 mg | Freq: Two times a day (BID) | INTRAVENOUS | Status: DC

## 2017-03-04 MED ORDER — SODIUM CHLORIDE 0.9 % IV SOLN
8.0000 mg/h | INTRAVENOUS | Status: DC
  Administered 2017-03-04: 8 mg/h via INTRAVENOUS
  Filled 2017-03-04: qty 80

## 2017-03-04 MED ORDER — PANTOPRAZOLE SODIUM 40 MG IV SOLR
80.0000 mg | Freq: Once | INTRAVENOUS | Status: AC
  Administered 2017-03-04: 18:00:00 80 mg via INTRAVENOUS
  Filled 2017-03-04: qty 80

## 2017-03-04 NOTE — ED Notes (Signed)
Pt NAD at this time and understands need for Transfer to [facility].

## 2017-03-04 NOTE — ED Triage Notes (Signed)
Pt arrived by EMS from [facility] after having labs drawn today the results indicate low hemoglobin of 5.7.

## 2017-03-04 NOTE — ED Notes (Signed)
Hooked pt up to monitor.

## 2017-03-04 NOTE — ED Provider Notes (Addendum)
[facility] Emergency Department Provider Note  ____________________________________________  Time seen: Approximately 3:23 PM  I have reviewed the triage vital signs and the nursing notes.   HISTORY  Chief Complaint Low Hemoglobin    HPI Roberto Perez is a 66 y.o. male history of multiple chronic illnesses including [facility] ED, liver cirrhosis, DM, and anemia of chronic disease as well as history of GI-related blood loss requiring repeat transfusions presenting for anemia. The patient underwent routine blood work yesterday and was found to have a hematocrit of 17.2 and hemoglobin of 5.7. He reports that he has not been experiencing any lightheadedness, shortness of breath, syncope, or generalized weakness above his normal decompensation. He reports that he has been able to participate in his physical therapy. He reports multiple episodes of daily black stool, which she attributes to his iron supplementationin conjunction with lactulose use for his cirrhosis. The patient is not anticoagulated, not even aspirin, despite having a known lower extremity DVT. He is on daily pantoprazole and ferrous sulfate.   Past Medical History:  Diagnosis Date  . Anemia   . Angina at rest [facility])   . Cellulitis   . Diabetes mellitus without complication ([place])   . Dysphagia   . Dyspnea   . Gastrointestinal hemorrhage   . Gout   . Lymphedema   . Phlebitis and thrombophlebitis of unspecified popliteal vein ([place])   . Thyroid disease   . Venous insufficiency (chronic) (peripheral)     Patient Active Problem List   Diagnosis Date Noted  . Bacteremia   . Encephalopathy 02/12/2017  . Ascites   . End stage liver disease ([place])   . Palliative care encounter   . DNR (do not resuscitate)     History reviewed. No pertinent surgical history.  Current Outpatient Rx  . Order #: [id number] Class: Normal  . Order #: [id number] Class: Historical Med  . Order #: [id number] Class: Normal  .  Order #: [id number] Class: Normal  . Order #: [id number] Class: Normal  . Order #: [id number] Class: Historical Med  . Order #: [id number] Class: Historical Med  . Order #: [id number] Class: Historical Med  . Order #: [id number] Class: Historical Med  . Order #: [id number] Class: Normal  . Order #: [id number] Class: Normal    Allergies Patient has no known allergies.  History reviewed. No pertinent family history.  Social History Social History  Substance Use Topics  . Smoking status: Former Research scientist (life sciences)  . Smokeless tobacco: Never Used  . Alcohol use No    Review of Systems Constitutional: No fever/chills. No lightheadedness or syncope. Eyes: No visual changes. ENT:  No congestion or rhinorrhea. Cardiovascular: Denies chest pain. Denies palpitations. Respiratory: Denies shortness of breath.  No cough. Gastrointestinal: No abdominal pain.  No nausea, no vomiting.  No diarrhea.  No constipation. Chronic abdominal ascites. Genitourinary: Negative for dysuria. Positive dark stool with chronic diarrhea from lactulose. Musculoskeletal: Negative for back pain. Skin: Negative for rash. Neurological: Negative for headaches. No focal numbness, tingling or weakness.   10-point ROS otherwise negative.  ____________________________________________   PHYSICAL EXAM:  VITAL SIGNS: ED Triage Vitals  Enc Vitals Group     BP 03/04/17 1437 98/61     Pulse Rate 03/04/17 1437 (!) 51     Resp 03/04/17 1437 14     Temp --      Temp src --      SpO2 03/04/17 1437 100 %     Weight --      Height --      Head  Circumference --      [place] Flow --      Pain Score 03/04/17 1436 0     Pain Loc --      Pain Edu? --      Excl. in [place]? --     Constitutional: Alert and oriented. Chronically ill appearing but nontoxic. Answers questions appropriately. Eyes: Conjunctivae have conjunctival pallor.  EOMI. No scleral icterus. Head: Atraumatic. Nose: No congestion/rhinnorhea. Mouth/Throat: Mucous membranes are  mildly dry.  Neck: No stridor.  Supple.  No meningismus. Cardiovascular: Slow rate, regular rhythm. No murmurs, rubs or gallops.  Respiratory: Normal respiratory effort.  No accessory muscle use or retractions. Lungs CTAB.  No wheezes, rales or ronchi. Gastrointestinal: Soft, nontender and not distended but obvious fluid wave consistent with ascites.  No guarding or rebound.  No peritoneal signs. Genitourinary: Multiple nonthrombosed, nonbleeding hemorrhoids externally without any palpable internal hemorrhoids. Stool is brown with some mucus but guaiac positive. No discomfort with rectal examination Musculoskeletal: Bilateral lower extremity edema to the mid thighs. Neurologic:  A&Ox3.  Speech is clear.  Face and smile are symmetric.  EOMI.  Moves all extremities well. Skin:  Skin is warm, dry. No rash noted. Psychiatric: Mood and affect are normal. Speech and behavior are normal.  Normal judgement.  ____________________________________________   LABS (all labs ordered are listed, but only abnormal results are displayed)  Labs Reviewed  BASIC METABOLIC PANEL - Abnormal; Notable for the following:       Result Value   Chloride 112 (*)    Glucose, Bld 199 (*)    BUN 35 (*)    Creatinine, Ser 1.81 (*)    Calcium 8.7 (*)    GFR calc non Af Amer 37 (*)    GFR calc Af Amer 43 (*)    All other components within normal limits  CBC WITH DIFFERENTIAL/PLATELET - Abnormal; Notable for the following:    WBC 2.3 (*)    RBC 2.06 (*)    Hemoglobin 6.8 (*)    HCT 21.4 (*)    MCV 103.8 (*)    RDW 24.7 (*)    Platelets 66 (*)    Lymphs Abs 0.4 (*)    All other components within normal limits  PROTIME-INR  APTT  HEPATIC FUNCTION PANEL  TYPE AND SCREEN   ____________________________________________  EKG  ED ECG REPORT I, Eula Listen, the attending physician, personally viewed and interpreted this ECG.   Date: 03/04/2017  EKG Time: 1435  Poor baseline tracing, but rate appears  in the 50's, + PVC, leftward axis, no STEMI.  Repeat EKG ED ECG REPORT I, Eula Listen, the attending physician, personally viewed and interpreted this ECG.   Date: 03/04/2017  EKG Time: 1528  Rate: 54  Rhythm: sinus bradycardia  Axis: leftward  Intervals:Prolonged PR interval  ST&T Change: No STEMI  ____________________________________________  RADIOLOGY  No results found.  ____________________________________________   PROCEDURES  Procedure(s) performed: None  Procedures  Critical Care performed: Yes, see critical care note(s) ____________________________________________   INITIAL IMPRESSION / ASSESSMENT AND PLAN / ED COURSE  Pertinent labs & imaging results that were available during my care of the patient were reviewed by me and considered in my medical decision making (see chart for details).  66 y.o. male with multiple chronic disease states including anemia from chronic disease as well as prior gastrointestinal loss presenting with low hemoglobin and hematocrit. Overall, the patient's vital signs are concerning for hypotension of 92/57 on my examination, no heart rate  in the low 50s. Although the patient is asymptomatic, he does have guaiac positive stool.  While the patient had EGD, small bowel all through, and colonoscopy earlier this month at [facility] which did not show esophageal varices, the patient has had varices in the past and I'm concerned about upper GI bleed. I will initiate a protonix bolus with drip. I will plan to admit the patient for likely transfusion and ongoing care.  ----------------------------------------- 3:36 PM on 03/04/2017 -----------------------------------------  The patient does have a hemoglobin of 6.8 and a hematocrit of 21.4 here. Two weeks ago, his hemoglobin was 8.0.  However, given that he has evidence of bleeding and hypotension, he will require transfusion as well as admission. Unfortunate, we do not have GI coverage today,  and the patient has previously been followed at [facility], so I have made a call to the [facility] transfer center for transfer.  ----------------------------------------- 4:41 PM on 03/04/2017 -----------------------------------------  Patient is a been accepted for transfer to [facility].  At this time, the patient's blood pressure has normalized and his bradycardia is stable. He has received a Protonix bolus and will have a drip initiated. His blood is currently being typed and screened for transfusion; if it is completed prior to transport, he will be transfused en route.   CRITICAL CARE Performed by: Eula Listen   Total critical care time: 45 minutes  Critical care time was exclusive of separately billable procedures and treating other patients.  Critical care was necessary to treat or prevent imminent or life-threatening deterioration.  Critical care was time spent personally by me on the following activities: development of treatment plan with patient and/or surrogate as well as nursing, discussions with consultants, evaluation of patient's response to treatment, examination of patient, obtaining history from patient or surrogate, ordering and performing treatments and interventions, ordering and review of laboratory studies, ordering and review of radiographic studies, pulse oximetry and re-evaluation of patient's condition.     ____________________________________________  FINAL CLINICAL IMPRESSION(S) / ED DIAGNOSES  Final diagnoses:  Gastrointestinal hemorrhage, unspecified gastrointestinal hemorrhage type  Anemia, unspecified type         NEW MEDICATIONS STARTED DURING THIS VISIT:  New Prescriptions   No medications on file      Eula Listen, MD 03/04/17 1540    Eula Listen, MD 03/04/17 1643

## 2017-03-05 DIAGNOSIS — K228 Other specified diseases of esophagus: Secondary | ICD-10-CM | POA: Diagnosis not present

## 2017-03-05 DIAGNOSIS — K766 Portal hypertension: Secondary | ICD-10-CM | POA: Diagnosis not present

## 2017-03-05 DIAGNOSIS — K921 Melena: Secondary | ICD-10-CM | POA: Diagnosis not present

## 2017-03-05 DIAGNOSIS — K3189 Other diseases of stomach and duodenum: Secondary | ICD-10-CM | POA: Diagnosis not present

## 2017-03-05 DIAGNOSIS — K7581 Nonalcoholic steatohepatitis (NASH): Secondary | ICD-10-CM | POA: Diagnosis not present

## 2017-03-05 DIAGNOSIS — R601 Generalized edema: Secondary | ICD-10-CM | POA: Diagnosis not present

## 2017-03-05 DIAGNOSIS — E119 Type 2 diabetes mellitus without complications: Secondary | ICD-10-CM | POA: Diagnosis not present

## 2017-03-05 DIAGNOSIS — D500 Iron deficiency anemia secondary to blood loss (chronic): Secondary | ICD-10-CM | POA: Diagnosis not present

## 2017-03-06 DIAGNOSIS — D500 Iron deficiency anemia secondary to blood loss (chronic): Secondary | ICD-10-CM | POA: Diagnosis not present

## 2017-03-06 DIAGNOSIS — K7581 Nonalcoholic steatohepatitis (NASH): Secondary | ICD-10-CM | POA: Diagnosis not present

## 2017-03-06 DIAGNOSIS — R601 Generalized edema: Secondary | ICD-10-CM | POA: Diagnosis not present

## 2017-03-06 DIAGNOSIS — E119 Type 2 diabetes mellitus without complications: Secondary | ICD-10-CM | POA: Diagnosis not present

## 2017-03-06 LAB — BPAM RBC
BLOOD PRODUCT EXPIRATION DATE: 201805052359
Blood Product Expiration Date: 201805112359
Blood Product Expiration Date: 201805242359
UNIT TYPE AND RH: 9500
Unit Type and Rh: 9500
Unit Type and Rh: 9500

## 2017-03-06 LAB — TYPE AND SCREEN
ABO/RH(D): O NEG
ANTIBODY SCREEN: POSITIVE
UNIT DIVISION: 0
Unit division: 0
Unit division: 0

## 2017-03-07 DIAGNOSIS — R2681 Unsteadiness on feet: Secondary | ICD-10-CM | POA: Diagnosis not present

## 2017-03-07 DIAGNOSIS — R601 Generalized edema: Secondary | ICD-10-CM | POA: Diagnosis not present

## 2017-03-07 DIAGNOSIS — Z741 Need for assistance with personal care: Secondary | ICD-10-CM | POA: Diagnosis not present

## 2017-03-07 DIAGNOSIS — G9340 Encephalopathy, unspecified: Secondary | ICD-10-CM | POA: Diagnosis not present

## 2017-03-07 DIAGNOSIS — E119 Type 2 diabetes mellitus without complications: Secondary | ICD-10-CM | POA: Diagnosis not present

## 2017-03-07 DIAGNOSIS — D500 Iron deficiency anemia secondary to blood loss (chronic): Secondary | ICD-10-CM | POA: Diagnosis not present

## 2017-03-07 DIAGNOSIS — K7581 Nonalcoholic steatohepatitis (NASH): Secondary | ICD-10-CM | POA: Diagnosis not present

## 2017-03-07 DIAGNOSIS — R262 Difficulty in walking, not elsewhere classified: Secondary | ICD-10-CM | POA: Diagnosis not present

## 2017-03-07 DIAGNOSIS — D638 Anemia in other chronic diseases classified elsewhere: Secondary | ICD-10-CM | POA: Diagnosis not present

## 2017-03-07 DIAGNOSIS — K7460 Unspecified cirrhosis of liver: Secondary | ICD-10-CM | POA: Diagnosis not present

## 2017-03-07 DIAGNOSIS — R1310 Dysphagia, unspecified: Secondary | ICD-10-CM | POA: Diagnosis not present

## 2017-03-07 DIAGNOSIS — N3000 Acute cystitis without hematuria: Secondary | ICD-10-CM | POA: Diagnosis not present

## 2017-03-07 DIAGNOSIS — N179 Acute kidney failure, unspecified: Secondary | ICD-10-CM | POA: Diagnosis not present

## 2017-03-07 DIAGNOSIS — M6281 Muscle weakness (generalized): Secondary | ICD-10-CM | POA: Diagnosis not present

## 2017-03-10 DIAGNOSIS — Z741 Need for assistance with personal care: Secondary | ICD-10-CM | POA: Diagnosis not present

## 2017-03-10 DIAGNOSIS — D61818 Other pancytopenia: Secondary | ICD-10-CM | POA: Diagnosis not present

## 2017-03-10 DIAGNOSIS — R1310 Dysphagia, unspecified: Secondary | ICD-10-CM | POA: Diagnosis not present

## 2017-03-10 DIAGNOSIS — K7460 Unspecified cirrhosis of liver: Secondary | ICD-10-CM | POA: Diagnosis not present

## 2017-03-10 DIAGNOSIS — N3000 Acute cystitis without hematuria: Secondary | ICD-10-CM | POA: Diagnosis not present

## 2017-03-10 DIAGNOSIS — E11622 Type 2 diabetes mellitus with other skin ulcer: Secondary | ICD-10-CM | POA: Diagnosis not present

## 2017-03-10 DIAGNOSIS — N179 Acute kidney failure, unspecified: Secondary | ICD-10-CM | POA: Diagnosis not present

## 2017-03-10 DIAGNOSIS — R262 Difficulty in walking, not elsewhere classified: Secondary | ICD-10-CM | POA: Diagnosis not present

## 2017-03-10 DIAGNOSIS — K922 Gastrointestinal hemorrhage, unspecified: Secondary | ICD-10-CM | POA: Diagnosis not present

## 2017-03-10 DIAGNOSIS — D649 Anemia, unspecified: Secondary | ICD-10-CM | POA: Diagnosis not present

## 2017-03-10 DIAGNOSIS — D500 Iron deficiency anemia secondary to blood loss (chronic): Secondary | ICD-10-CM | POA: Diagnosis not present

## 2017-03-10 DIAGNOSIS — D638 Anemia in other chronic diseases classified elsewhere: Secondary | ICD-10-CM | POA: Diagnosis not present

## 2017-03-10 DIAGNOSIS — G9340 Encephalopathy, unspecified: Secondary | ICD-10-CM | POA: Diagnosis not present

## 2017-03-10 DIAGNOSIS — R2681 Unsteadiness on feet: Secondary | ICD-10-CM | POA: Diagnosis not present

## 2017-03-10 DIAGNOSIS — M6281 Muscle weakness (generalized): Secondary | ICD-10-CM | POA: Diagnosis not present

## 2017-03-17 DIAGNOSIS — D649 Anemia, unspecified: Secondary | ICD-10-CM | POA: Diagnosis not present

## 2017-03-17 DIAGNOSIS — K7460 Unspecified cirrhosis of liver: Secondary | ICD-10-CM | POA: Diagnosis not present

## 2017-03-17 DIAGNOSIS — I890 Lymphedema, not elsewhere classified: Secondary | ICD-10-CM | POA: Diagnosis not present

## 2017-03-17 DIAGNOSIS — K760 Fatty (change of) liver, not elsewhere classified: Secondary | ICD-10-CM | POA: Diagnosis not present

## 2017-03-17 DIAGNOSIS — L97221 Non-pressure chronic ulcer of left calf limited to breakdown of skin: Secondary | ICD-10-CM | POA: Diagnosis not present

## 2017-03-17 DIAGNOSIS — E11622 Type 2 diabetes mellitus with other skin ulcer: Secondary | ICD-10-CM | POA: Diagnosis not present

## 2017-03-17 DIAGNOSIS — D61818 Other pancytopenia: Secondary | ICD-10-CM | POA: Diagnosis not present

## 2017-03-17 DIAGNOSIS — K922 Gastrointestinal hemorrhage, unspecified: Secondary | ICD-10-CM | POA: Diagnosis not present

## 2017-03-17 DIAGNOSIS — E039 Hypothyroidism, unspecified: Secondary | ICD-10-CM | POA: Diagnosis not present

## 2017-03-17 DIAGNOSIS — I872 Venous insufficiency (chronic) (peripheral): Secondary | ICD-10-CM | POA: Diagnosis not present

## 2017-03-18 DIAGNOSIS — R600 Localized edema: Secondary | ICD-10-CM | POA: Diagnosis not present

## 2017-03-18 DIAGNOSIS — R918 Other nonspecific abnormal finding of lung field: Secondary | ICD-10-CM | POA: Diagnosis not present

## 2017-03-18 DIAGNOSIS — I129 Hypertensive chronic kidney disease with stage 1 through stage 4 chronic kidney disease, or unspecified chronic kidney disease: Secondary | ICD-10-CM | POA: Diagnosis not present

## 2017-03-18 DIAGNOSIS — M109 Gout, unspecified: Secondary | ICD-10-CM | POA: Diagnosis not present

## 2017-03-18 DIAGNOSIS — D61818 Other pancytopenia: Secondary | ICD-10-CM | POA: Diagnosis not present

## 2017-03-18 DIAGNOSIS — D500 Iron deficiency anemia secondary to blood loss (chronic): Secondary | ICD-10-CM | POA: Diagnosis not present

## 2017-03-18 DIAGNOSIS — K760 Fatty (change of) liver, not elsewhere classified: Secondary | ICD-10-CM | POA: Diagnosis not present

## 2017-03-18 DIAGNOSIS — E039 Hypothyroidism, unspecified: Secondary | ICD-10-CM | POA: Diagnosis not present

## 2017-03-18 DIAGNOSIS — Z794 Long term (current) use of insulin: Secondary | ICD-10-CM | POA: Diagnosis not present

## 2017-03-18 DIAGNOSIS — J90 Pleural effusion, not elsewhere classified: Secondary | ICD-10-CM | POA: Diagnosis not present

## 2017-03-18 DIAGNOSIS — G9340 Encephalopathy, unspecified: Secondary | ICD-10-CM | POA: Diagnosis not present

## 2017-03-18 DIAGNOSIS — E1151 Type 2 diabetes mellitus with diabetic peripheral angiopathy without gangrene: Secondary | ICD-10-CM | POA: Diagnosis not present

## 2017-03-18 DIAGNOSIS — R29898 Other symptoms and signs involving the musculoskeletal system: Secondary | ICD-10-CM | POA: Diagnosis not present

## 2017-03-18 DIAGNOSIS — J984 Other disorders of lung: Secondary | ICD-10-CM | POA: Diagnosis not present

## 2017-03-18 DIAGNOSIS — Z86718 Personal history of other venous thrombosis and embolism: Secondary | ICD-10-CM | POA: Diagnosis not present

## 2017-03-18 DIAGNOSIS — I872 Venous insufficiency (chronic) (peripheral): Secondary | ICD-10-CM | POA: Diagnosis not present

## 2017-03-18 DIAGNOSIS — Z8249 Family history of ischemic heart disease and other diseases of the circulatory system: Secondary | ICD-10-CM | POA: Diagnosis not present

## 2017-03-18 DIAGNOSIS — K7460 Unspecified cirrhosis of liver: Secondary | ICD-10-CM | POA: Diagnosis not present

## 2017-03-18 DIAGNOSIS — E1122 Type 2 diabetes mellitus with diabetic chronic kidney disease: Secondary | ICD-10-CM | POA: Diagnosis not present

## 2017-03-18 DIAGNOSIS — D649 Anemia, unspecified: Secondary | ICD-10-CM | POA: Diagnosis not present

## 2017-03-18 DIAGNOSIS — K529 Noninfective gastroenteritis and colitis, unspecified: Secondary | ICD-10-CM | POA: Diagnosis not present

## 2017-03-18 DIAGNOSIS — K3189 Other diseases of stomach and duodenum: Secondary | ICD-10-CM | POA: Diagnosis not present

## 2017-03-18 DIAGNOSIS — R001 Bradycardia, unspecified: Secondary | ICD-10-CM | POA: Diagnosis not present

## 2017-03-18 DIAGNOSIS — R6889 Other general symptoms and signs: Secondary | ICD-10-CM | POA: Diagnosis not present

## 2017-03-18 DIAGNOSIS — N183 Chronic kidney disease, stage 3 (moderate): Secondary | ICD-10-CM | POA: Diagnosis not present

## 2017-03-18 DIAGNOSIS — L97329 Non-pressure chronic ulcer of left ankle with unspecified severity: Secondary | ICD-10-CM | POA: Diagnosis not present

## 2017-03-18 DIAGNOSIS — K766 Portal hypertension: Secondary | ICD-10-CM | POA: Diagnosis not present

## 2017-03-18 DIAGNOSIS — Z87891 Personal history of nicotine dependence: Secondary | ICD-10-CM | POA: Diagnosis not present

## 2017-03-19 DIAGNOSIS — K7469 Other cirrhosis of liver: Secondary | ICD-10-CM | POA: Diagnosis not present

## 2017-03-19 DIAGNOSIS — E119 Type 2 diabetes mellitus without complications: Secondary | ICD-10-CM | POA: Diagnosis not present

## 2017-03-19 DIAGNOSIS — D500 Iron deficiency anemia secondary to blood loss (chronic): Secondary | ICD-10-CM | POA: Diagnosis not present

## 2017-03-19 DIAGNOSIS — D61818 Other pancytopenia: Secondary | ICD-10-CM | POA: Diagnosis not present

## 2017-03-20 DIAGNOSIS — I872 Venous insufficiency (chronic) (peripheral): Secondary | ICD-10-CM | POA: Diagnosis not present

## 2017-03-20 DIAGNOSIS — D61818 Other pancytopenia: Secondary | ICD-10-CM | POA: Diagnosis not present

## 2017-03-20 DIAGNOSIS — N3000 Acute cystitis without hematuria: Secondary | ICD-10-CM | POA: Diagnosis not present

## 2017-03-20 DIAGNOSIS — R262 Difficulty in walking, not elsewhere classified: Secondary | ICD-10-CM | POA: Diagnosis not present

## 2017-03-20 DIAGNOSIS — R2681 Unsteadiness on feet: Secondary | ICD-10-CM | POA: Diagnosis not present

## 2017-03-20 DIAGNOSIS — D638 Anemia in other chronic diseases classified elsewhere: Secondary | ICD-10-CM | POA: Diagnosis not present

## 2017-03-20 DIAGNOSIS — E119 Type 2 diabetes mellitus without complications: Secondary | ICD-10-CM | POA: Diagnosis not present

## 2017-03-20 DIAGNOSIS — N179 Acute kidney failure, unspecified: Secondary | ICD-10-CM | POA: Diagnosis not present

## 2017-03-20 DIAGNOSIS — M6281 Muscle weakness (generalized): Secondary | ICD-10-CM | POA: Diagnosis not present

## 2017-03-20 DIAGNOSIS — K7469 Other cirrhosis of liver: Secondary | ICD-10-CM | POA: Diagnosis not present

## 2017-03-20 DIAGNOSIS — R1310 Dysphagia, unspecified: Secondary | ICD-10-CM | POA: Diagnosis not present

## 2017-03-20 DIAGNOSIS — Z741 Need for assistance with personal care: Secondary | ICD-10-CM | POA: Diagnosis not present

## 2017-03-20 DIAGNOSIS — G9340 Encephalopathy, unspecified: Secondary | ICD-10-CM | POA: Diagnosis not present

## 2017-03-20 DIAGNOSIS — K760 Fatty (change of) liver, not elsewhere classified: Secondary | ICD-10-CM | POA: Diagnosis not present

## 2017-03-20 DIAGNOSIS — N183 Chronic kidney disease, stage 3 (moderate): Secondary | ICD-10-CM | POA: Diagnosis not present

## 2017-03-20 DIAGNOSIS — D500 Iron deficiency anemia secondary to blood loss (chronic): Secondary | ICD-10-CM | POA: Diagnosis not present

## 2017-03-20 DIAGNOSIS — K7460 Unspecified cirrhosis of liver: Secondary | ICD-10-CM | POA: Diagnosis not present

## 2017-03-24 DIAGNOSIS — K922 Gastrointestinal hemorrhage, unspecified: Secondary | ICD-10-CM | POA: Diagnosis not present

## 2017-03-24 DIAGNOSIS — E11622 Type 2 diabetes mellitus with other skin ulcer: Secondary | ICD-10-CM | POA: Diagnosis not present

## 2017-03-24 DIAGNOSIS — D61818 Other pancytopenia: Secondary | ICD-10-CM | POA: Diagnosis not present

## 2017-03-24 DIAGNOSIS — D649 Anemia, unspecified: Secondary | ICD-10-CM | POA: Diagnosis not present

## 2017-03-24 DIAGNOSIS — K7460 Unspecified cirrhosis of liver: Secondary | ICD-10-CM | POA: Diagnosis not present

## 2017-03-26 ENCOUNTER — Ambulatory Visit: Admission: RE | Admit: 2017-03-26 | Payer: PPO | Source: Ambulatory Visit

## 2017-03-31 DIAGNOSIS — I872 Venous insufficiency (chronic) (peripheral): Secondary | ICD-10-CM | POA: Diagnosis not present

## 2017-03-31 DIAGNOSIS — K7460 Unspecified cirrhosis of liver: Secondary | ICD-10-CM | POA: Diagnosis not present

## 2017-03-31 DIAGNOSIS — I890 Lymphedema, not elsewhere classified: Secondary | ICD-10-CM | POA: Diagnosis not present

## 2017-03-31 DIAGNOSIS — G9340 Encephalopathy, unspecified: Secondary | ICD-10-CM | POA: Diagnosis not present

## 2017-04-07 DIAGNOSIS — N3000 Acute cystitis without hematuria: Secondary | ICD-10-CM | POA: Diagnosis not present

## 2017-04-07 DIAGNOSIS — G9340 Encephalopathy, unspecified: Secondary | ICD-10-CM | POA: Diagnosis not present

## 2017-04-07 DIAGNOSIS — E1140 Type 2 diabetes mellitus with diabetic neuropathy, unspecified: Secondary | ICD-10-CM | POA: Diagnosis not present

## 2017-04-07 DIAGNOSIS — M6281 Muscle weakness (generalized): Secondary | ICD-10-CM | POA: Diagnosis not present

## 2017-04-07 DIAGNOSIS — N179 Acute kidney failure, unspecified: Secondary | ICD-10-CM | POA: Diagnosis not present

## 2017-04-07 DIAGNOSIS — K219 Gastro-esophageal reflux disease without esophagitis: Secondary | ICD-10-CM | POA: Diagnosis not present

## 2017-04-07 DIAGNOSIS — R2689 Other abnormalities of gait and mobility: Secondary | ICD-10-CM | POA: Diagnosis not present

## 2017-04-13 DIAGNOSIS — E1140 Type 2 diabetes mellitus with diabetic neuropathy, unspecified: Secondary | ICD-10-CM | POA: Diagnosis not present

## 2017-04-13 DIAGNOSIS — I872 Venous insufficiency (chronic) (peripheral): Secondary | ICD-10-CM | POA: Diagnosis not present

## 2017-04-13 DIAGNOSIS — R2689 Other abnormalities of gait and mobility: Secondary | ICD-10-CM | POA: Diagnosis not present

## 2017-04-13 DIAGNOSIS — K760 Fatty (change of) liver, not elsewhere classified: Secondary | ICD-10-CM | POA: Diagnosis not present

## 2017-04-13 DIAGNOSIS — Z794 Long term (current) use of insulin: Secondary | ICD-10-CM | POA: Diagnosis not present

## 2017-04-13 DIAGNOSIS — D500 Iron deficiency anemia secondary to blood loss (chronic): Secondary | ICD-10-CM | POA: Diagnosis not present

## 2017-04-13 DIAGNOSIS — G9340 Encephalopathy, unspecified: Secondary | ICD-10-CM | POA: Diagnosis not present

## 2017-04-13 DIAGNOSIS — M6281 Muscle weakness (generalized): Secondary | ICD-10-CM | POA: Diagnosis not present

## 2017-04-15 DIAGNOSIS — N179 Acute kidney failure, unspecified: Secondary | ICD-10-CM | POA: Diagnosis not present

## 2017-04-15 DIAGNOSIS — R600 Localized edema: Secondary | ICD-10-CM | POA: Diagnosis not present

## 2017-04-15 DIAGNOSIS — R601 Generalized edema: Secondary | ICD-10-CM | POA: Diagnosis not present

## 2017-04-15 DIAGNOSIS — N183 Chronic kidney disease, stage 3 (moderate): Secondary | ICD-10-CM | POA: Diagnosis not present

## 2017-04-15 DIAGNOSIS — R809 Proteinuria, unspecified: Secondary | ICD-10-CM | POA: Diagnosis not present

## 2017-04-16 DIAGNOSIS — L97401 Non-pressure chronic ulcer of unspecified heel and midfoot limited to breakdown of skin: Secondary | ICD-10-CM | POA: Diagnosis not present

## 2017-04-16 DIAGNOSIS — Z6828 Body mass index (BMI) 28.0-28.9, adult: Secondary | ICD-10-CM | POA: Diagnosis not present

## 2017-04-27 DIAGNOSIS — K760 Fatty (change of) liver, not elsewhere classified: Secondary | ICD-10-CM | POA: Diagnosis not present

## 2017-04-27 DIAGNOSIS — D500 Iron deficiency anemia secondary to blood loss (chronic): Secondary | ICD-10-CM | POA: Diagnosis not present

## 2017-04-27 DIAGNOSIS — E1140 Type 2 diabetes mellitus with diabetic neuropathy, unspecified: Secondary | ICD-10-CM | POA: Diagnosis not present

## 2017-05-12 DIAGNOSIS — Z8249 Family history of ischemic heart disease and other diseases of the circulatory system: Secondary | ICD-10-CM | POA: Diagnosis not present

## 2017-05-12 DIAGNOSIS — Z79899 Other long term (current) drug therapy: Secondary | ICD-10-CM | POA: Diagnosis not present

## 2017-05-12 DIAGNOSIS — Z6830 Body mass index (BMI) 30.0-30.9, adult: Secondary | ICD-10-CM | POA: Diagnosis not present

## 2017-05-12 DIAGNOSIS — E11622 Type 2 diabetes mellitus with other skin ulcer: Secondary | ICD-10-CM | POA: Diagnosis not present

## 2017-05-12 DIAGNOSIS — Z792 Long term (current) use of antibiotics: Secondary | ICD-10-CM | POA: Diagnosis not present

## 2017-05-12 DIAGNOSIS — E039 Hypothyroidism, unspecified: Secondary | ICD-10-CM | POA: Diagnosis not present

## 2017-05-12 DIAGNOSIS — K922 Gastrointestinal hemorrhage, unspecified: Secondary | ICD-10-CM | POA: Diagnosis not present

## 2017-05-12 DIAGNOSIS — Z9049 Acquired absence of other specified parts of digestive tract: Secondary | ICD-10-CM | POA: Diagnosis not present

## 2017-05-12 DIAGNOSIS — Z79890 Hormone replacement therapy (postmenopausal): Secondary | ICD-10-CM | POA: Diagnosis not present

## 2017-05-12 DIAGNOSIS — D500 Iron deficiency anemia secondary to blood loss (chronic): Secondary | ICD-10-CM | POA: Diagnosis not present

## 2017-05-12 DIAGNOSIS — D61818 Other pancytopenia: Secondary | ICD-10-CM | POA: Diagnosis not present

## 2017-05-12 DIAGNOSIS — R161 Splenomegaly, not elsewhere classified: Secondary | ICD-10-CM | POA: Diagnosis not present

## 2017-05-12 DIAGNOSIS — I739 Peripheral vascular disease, unspecified: Secondary | ICD-10-CM | POA: Diagnosis not present

## 2017-05-12 DIAGNOSIS — K760 Fatty (change of) liver, not elsewhere classified: Secondary | ICD-10-CM | POA: Diagnosis not present

## 2017-05-12 DIAGNOSIS — K7460 Unspecified cirrhosis of liver: Secondary | ICD-10-CM | POA: Diagnosis not present

## 2017-05-12 DIAGNOSIS — L97929 Non-pressure chronic ulcer of unspecified part of left lower leg with unspecified severity: Secondary | ICD-10-CM | POA: Diagnosis not present

## 2017-05-12 DIAGNOSIS — I10 Essential (primary) hypertension: Secondary | ICD-10-CM | POA: Diagnosis not present

## 2017-05-12 DIAGNOSIS — Z833 Family history of diabetes mellitus: Secondary | ICD-10-CM | POA: Diagnosis not present

## 2017-05-12 DIAGNOSIS — Z7984 Long term (current) use of oral hypoglycemic drugs: Secondary | ICD-10-CM | POA: Diagnosis not present

## 2017-05-12 DIAGNOSIS — I82402 Acute embolism and thrombosis of unspecified deep veins of left lower extremity: Secondary | ICD-10-CM | POA: Diagnosis not present

## 2017-05-19 DIAGNOSIS — I83202 Varicose veins of unspecified lower extremity with both ulcer of calf and inflammation: Secondary | ICD-10-CM | POA: Diagnosis not present

## 2017-05-19 DIAGNOSIS — I82439 Acute embolism and thrombosis of unspecified popliteal vein: Secondary | ICD-10-CM | POA: Diagnosis not present

## 2017-05-19 DIAGNOSIS — L97309 Non-pressure chronic ulcer of unspecified ankle with unspecified severity: Secondary | ICD-10-CM | POA: Diagnosis not present

## 2017-05-19 DIAGNOSIS — E11622 Type 2 diabetes mellitus with other skin ulcer: Secondary | ICD-10-CM | POA: Diagnosis not present

## 2017-05-19 DIAGNOSIS — D61818 Other pancytopenia: Secondary | ICD-10-CM | POA: Diagnosis not present

## 2017-05-19 DIAGNOSIS — N183 Chronic kidney disease, stage 3 (moderate): Secondary | ICD-10-CM | POA: Diagnosis not present

## 2017-05-19 DIAGNOSIS — R188 Other ascites: Secondary | ICD-10-CM | POA: Diagnosis not present

## 2017-05-19 DIAGNOSIS — R079 Chest pain, unspecified: Secondary | ICD-10-CM | POA: Diagnosis not present

## 2017-05-19 DIAGNOSIS — E1159 Type 2 diabetes mellitus with other circulatory complications: Secondary | ICD-10-CM | POA: Diagnosis not present

## 2017-05-19 DIAGNOSIS — E1151 Type 2 diabetes mellitus with diabetic peripheral angiopathy without gangrene: Secondary | ICD-10-CM | POA: Diagnosis not present

## 2017-05-19 DIAGNOSIS — D500 Iron deficiency anemia secondary to blood loss (chronic): Secondary | ICD-10-CM | POA: Diagnosis not present

## 2017-05-19 DIAGNOSIS — I129 Hypertensive chronic kidney disease with stage 1 through stage 4 chronic kidney disease, or unspecified chronic kidney disease: Secondary | ICD-10-CM | POA: Diagnosis not present

## 2017-05-19 DIAGNOSIS — L97209 Non-pressure chronic ulcer of unspecified calf with unspecified severity: Secondary | ICD-10-CM | POA: Diagnosis not present

## 2017-05-19 DIAGNOSIS — K3189 Other diseases of stomach and duodenum: Secondary | ICD-10-CM | POA: Diagnosis not present

## 2017-05-19 DIAGNOSIS — I8393 Asymptomatic varicose veins of bilateral lower extremities: Secondary | ICD-10-CM | POA: Diagnosis not present

## 2017-05-19 DIAGNOSIS — E039 Hypothyroidism, unspecified: Secondary | ICD-10-CM | POA: Diagnosis not present

## 2017-05-19 DIAGNOSIS — K7460 Unspecified cirrhosis of liver: Secondary | ICD-10-CM | POA: Diagnosis not present

## 2017-05-19 DIAGNOSIS — E11620 Type 2 diabetes mellitus with diabetic dermatitis: Secondary | ICD-10-CM | POA: Diagnosis not present

## 2017-05-19 DIAGNOSIS — K920 Hematemesis: Secondary | ICD-10-CM | POA: Diagnosis not present

## 2017-05-19 DIAGNOSIS — Z6832 Body mass index (BMI) 32.0-32.9, adult: Secondary | ICD-10-CM | POA: Diagnosis not present

## 2017-05-19 DIAGNOSIS — E1122 Type 2 diabetes mellitus with diabetic chronic kidney disease: Secondary | ICD-10-CM | POA: Diagnosis not present

## 2017-05-19 DIAGNOSIS — K760 Fatty (change of) liver, not elsewhere classified: Secondary | ICD-10-CM | POA: Diagnosis not present

## 2017-05-19 DIAGNOSIS — D126 Benign neoplasm of colon, unspecified: Secondary | ICD-10-CM | POA: Diagnosis not present

## 2017-05-19 DIAGNOSIS — L905 Scar conditions and fibrosis of skin: Secondary | ICD-10-CM | POA: Diagnosis not present

## 2017-05-19 DIAGNOSIS — K7290 Hepatic failure, unspecified without coma: Secondary | ICD-10-CM | POA: Diagnosis not present

## 2017-06-01 DIAGNOSIS — R188 Other ascites: Secondary | ICD-10-CM | POA: Diagnosis not present

## 2017-06-01 DIAGNOSIS — K7460 Unspecified cirrhosis of liver: Secondary | ICD-10-CM | POA: Diagnosis not present

## 2017-06-01 DIAGNOSIS — D509 Iron deficiency anemia, unspecified: Secondary | ICD-10-CM | POA: Diagnosis not present

## 2017-06-01 DIAGNOSIS — R0602 Shortness of breath: Secondary | ICD-10-CM | POA: Diagnosis not present

## 2017-06-06 DIAGNOSIS — K7460 Unspecified cirrhosis of liver: Secondary | ICD-10-CM | POA: Diagnosis not present

## 2017-06-06 DIAGNOSIS — R188 Other ascites: Secondary | ICD-10-CM | POA: Diagnosis not present

## 2017-06-06 DIAGNOSIS — D509 Iron deficiency anemia, unspecified: Secondary | ICD-10-CM | POA: Diagnosis not present

## 2017-06-06 DIAGNOSIS — R0602 Shortness of breath: Secondary | ICD-10-CM | POA: Diagnosis not present

## 2017-06-21 DIAGNOSIS — I871 Compression of vein: Secondary | ICD-10-CM | POA: Diagnosis not present

## 2017-06-21 DIAGNOSIS — Z9889 Other specified postprocedural states: Secondary | ICD-10-CM | POA: Diagnosis not present

## 2017-06-21 DIAGNOSIS — D61818 Other pancytopenia: Secondary | ICD-10-CM | POA: Diagnosis not present

## 2017-06-21 DIAGNOSIS — R9431 Abnormal electrocardiogram [ECG] [EKG]: Secondary | ICD-10-CM | POA: Diagnosis not present

## 2017-06-21 DIAGNOSIS — D509 Iron deficiency anemia, unspecified: Secondary | ICD-10-CM | POA: Diagnosis not present

## 2017-06-21 DIAGNOSIS — R918 Other nonspecific abnormal finding of lung field: Secondary | ICD-10-CM | POA: Diagnosis not present

## 2017-06-21 DIAGNOSIS — L97929 Non-pressure chronic ulcer of unspecified part of left lower leg with unspecified severity: Secondary | ICD-10-CM | POA: Diagnosis not present

## 2017-06-21 DIAGNOSIS — I4510 Unspecified right bundle-branch block: Secondary | ICD-10-CM | POA: Diagnosis not present

## 2017-06-21 DIAGNOSIS — N179 Acute kidney failure, unspecified: Secondary | ICD-10-CM | POA: Diagnosis not present

## 2017-06-21 DIAGNOSIS — M1009 Idiopathic gout, multiple sites: Secondary | ICD-10-CM | POA: Diagnosis not present

## 2017-06-21 DIAGNOSIS — R188 Other ascites: Secondary | ICD-10-CM | POA: Diagnosis not present

## 2017-06-21 DIAGNOSIS — K760 Fatty (change of) liver, not elsewhere classified: Secondary | ICD-10-CM | POA: Diagnosis not present

## 2017-06-21 DIAGNOSIS — E1151 Type 2 diabetes mellitus with diabetic peripheral angiopathy without gangrene: Secondary | ICD-10-CM | POA: Diagnosis not present

## 2017-06-21 DIAGNOSIS — D500 Iron deficiency anemia secondary to blood loss (chronic): Secondary | ICD-10-CM | POA: Diagnosis not present

## 2017-06-21 DIAGNOSIS — Z87891 Personal history of nicotine dependence: Secondary | ICD-10-CM | POA: Diagnosis not present

## 2017-06-21 DIAGNOSIS — N183 Chronic kidney disease, stage 3 (moderate): Secondary | ICD-10-CM | POA: Diagnosis not present

## 2017-06-21 DIAGNOSIS — I872 Venous insufficiency (chronic) (peripheral): Secondary | ICD-10-CM | POA: Diagnosis not present

## 2017-06-21 DIAGNOSIS — R0989 Other specified symptoms and signs involving the circulatory and respiratory systems: Secondary | ICD-10-CM | POA: Diagnosis not present

## 2017-06-21 DIAGNOSIS — K7460 Unspecified cirrhosis of liver: Secondary | ICD-10-CM | POA: Diagnosis not present

## 2017-06-21 DIAGNOSIS — E039 Hypothyroidism, unspecified: Secondary | ICD-10-CM | POA: Diagnosis not present

## 2017-06-21 DIAGNOSIS — Z794 Long term (current) use of insulin: Secondary | ICD-10-CM | POA: Diagnosis not present

## 2017-06-21 DIAGNOSIS — E11628 Type 2 diabetes mellitus with other skin complications: Secondary | ICD-10-CM | POA: Diagnosis not present

## 2017-06-21 DIAGNOSIS — L03119 Cellulitis of unspecified part of limb: Secondary | ICD-10-CM | POA: Diagnosis not present

## 2017-06-21 DIAGNOSIS — K7290 Hepatic failure, unspecified without coma: Secondary | ICD-10-CM | POA: Diagnosis not present

## 2017-06-21 DIAGNOSIS — Z8669 Personal history of other diseases of the nervous system and sense organs: Secondary | ICD-10-CM | POA: Diagnosis not present

## 2017-06-21 DIAGNOSIS — J90 Pleural effusion, not elsewhere classified: Secondary | ICD-10-CM | POA: Diagnosis not present

## 2017-06-21 DIAGNOSIS — R262 Difficulty in walking, not elsewhere classified: Secondary | ICD-10-CM | POA: Diagnosis not present

## 2017-06-21 DIAGNOSIS — E1122 Type 2 diabetes mellitus with diabetic chronic kidney disease: Secondary | ICD-10-CM | POA: Diagnosis not present

## 2017-06-21 DIAGNOSIS — L309 Dermatitis, unspecified: Secondary | ICD-10-CM | POA: Diagnosis not present

## 2017-06-21 DIAGNOSIS — I82503 Chronic embolism and thrombosis of unspecified deep veins of lower extremity, bilateral: Secondary | ICD-10-CM | POA: Diagnosis not present

## 2017-06-21 DIAGNOSIS — R21 Rash and other nonspecific skin eruption: Secondary | ICD-10-CM | POA: Diagnosis not present

## 2017-06-21 DIAGNOSIS — N5089 Other specified disorders of the male genital organs: Secondary | ICD-10-CM | POA: Diagnosis not present

## 2017-06-21 DIAGNOSIS — E1159 Type 2 diabetes mellitus with other circulatory complications: Secondary | ICD-10-CM | POA: Diagnosis not present

## 2017-06-21 DIAGNOSIS — R140 Abdominal distension (gaseous): Secondary | ICD-10-CM | POA: Diagnosis not present

## 2017-06-21 DIAGNOSIS — E11622 Type 2 diabetes mellitus with other skin ulcer: Secondary | ICD-10-CM | POA: Diagnosis not present

## 2017-06-21 DIAGNOSIS — I10 Essential (primary) hypertension: Secondary | ICD-10-CM | POA: Diagnosis not present

## 2017-06-21 DIAGNOSIS — K766 Portal hypertension: Secondary | ICD-10-CM | POA: Diagnosis not present

## 2017-06-21 DIAGNOSIS — E1165 Type 2 diabetes mellitus with hyperglycemia: Secondary | ICD-10-CM | POA: Diagnosis not present

## 2017-06-21 DIAGNOSIS — Z6833 Body mass index (BMI) 33.0-33.9, adult: Secondary | ICD-10-CM | POA: Diagnosis not present

## 2017-06-21 DIAGNOSIS — K7469 Other cirrhosis of liver: Secondary | ICD-10-CM | POA: Diagnosis not present

## 2017-06-21 DIAGNOSIS — I129 Hypertensive chronic kidney disease with stage 1 through stage 4 chronic kidney disease, or unspecified chronic kidney disease: Secondary | ICD-10-CM | POA: Diagnosis not present

## 2017-06-21 DIAGNOSIS — I491 Atrial premature depolarization: Secondary | ICD-10-CM | POA: Diagnosis not present

## 2017-06-21 DIAGNOSIS — L03116 Cellulitis of left lower limb: Secondary | ICD-10-CM | POA: Diagnosis not present

## 2017-06-21 DIAGNOSIS — M6281 Muscle weakness (generalized): Secondary | ICD-10-CM | POA: Diagnosis not present

## 2017-06-25 DIAGNOSIS — E039 Hypothyroidism, unspecified: Secondary | ICD-10-CM | POA: Diagnosis not present

## 2017-06-25 DIAGNOSIS — D649 Anemia, unspecified: Secondary | ICD-10-CM | POA: Diagnosis not present

## 2017-06-25 DIAGNOSIS — K7581 Nonalcoholic steatohepatitis (NASH): Secondary | ICD-10-CM | POA: Diagnosis not present

## 2017-06-25 DIAGNOSIS — M109 Gout, unspecified: Secondary | ICD-10-CM | POA: Diagnosis not present

## 2017-06-25 DIAGNOSIS — L03116 Cellulitis of left lower limb: Secondary | ICD-10-CM | POA: Diagnosis not present

## 2017-06-25 DIAGNOSIS — E119 Type 2 diabetes mellitus without complications: Secondary | ICD-10-CM | POA: Diagnosis not present

## 2017-06-25 DIAGNOSIS — M6281 Muscle weakness (generalized): Secondary | ICD-10-CM | POA: Diagnosis not present

## 2017-06-25 DIAGNOSIS — L03119 Cellulitis of unspecified part of limb: Secondary | ICD-10-CM | POA: Diagnosis not present

## 2017-06-25 DIAGNOSIS — R188 Other ascites: Secondary | ICD-10-CM | POA: Diagnosis not present

## 2017-06-25 DIAGNOSIS — R0602 Shortness of breath: Secondary | ICD-10-CM | POA: Diagnosis not present

## 2017-06-25 DIAGNOSIS — K7460 Unspecified cirrhosis of liver: Secondary | ICD-10-CM | POA: Diagnosis not present

## 2017-06-25 DIAGNOSIS — I10 Essential (primary) hypertension: Secondary | ICD-10-CM | POA: Diagnosis not present

## 2017-06-25 DIAGNOSIS — E1165 Type 2 diabetes mellitus with hyperglycemia: Secondary | ICD-10-CM | POA: Diagnosis not present

## 2017-06-25 DIAGNOSIS — Z794 Long term (current) use of insulin: Secondary | ICD-10-CM | POA: Diagnosis not present

## 2017-06-25 DIAGNOSIS — M1009 Idiopathic gout, multiple sites: Secondary | ICD-10-CM | POA: Diagnosis not present

## 2017-06-25 DIAGNOSIS — R21 Rash and other nonspecific skin eruption: Secondary | ICD-10-CM | POA: Diagnosis not present

## 2017-06-25 DIAGNOSIS — K7469 Other cirrhosis of liver: Secondary | ICD-10-CM | POA: Diagnosis not present

## 2017-06-25 DIAGNOSIS — D509 Iron deficiency anemia, unspecified: Secondary | ICD-10-CM | POA: Diagnosis not present

## 2017-06-25 DIAGNOSIS — R262 Difficulty in walking, not elsewhere classified: Secondary | ICD-10-CM | POA: Diagnosis not present

## 2017-06-28 DIAGNOSIS — K7581 Nonalcoholic steatohepatitis (NASH): Secondary | ICD-10-CM | POA: Diagnosis not present

## 2017-06-28 DIAGNOSIS — L03116 Cellulitis of left lower limb: Secondary | ICD-10-CM | POA: Diagnosis not present

## 2017-06-28 DIAGNOSIS — M109 Gout, unspecified: Secondary | ICD-10-CM | POA: Diagnosis not present

## 2017-06-28 DIAGNOSIS — Z794 Long term (current) use of insulin: Secondary | ICD-10-CM | POA: Diagnosis not present

## 2017-06-28 DIAGNOSIS — E119 Type 2 diabetes mellitus without complications: Secondary | ICD-10-CM | POA: Diagnosis not present

## 2017-06-28 DIAGNOSIS — D649 Anemia, unspecified: Secondary | ICD-10-CM | POA: Diagnosis not present

## 2017-06-28 DIAGNOSIS — M6281 Muscle weakness (generalized): Secondary | ICD-10-CM | POA: Diagnosis not present

## 2017-06-28 DIAGNOSIS — E039 Hypothyroidism, unspecified: Secondary | ICD-10-CM | POA: Diagnosis not present

## 2017-07-02 ENCOUNTER — Other Ambulatory Visit: Payer: Self-pay | Admitting: *Deleted

## 2017-07-02 DIAGNOSIS — E039 Hypothyroidism, unspecified: Secondary | ICD-10-CM | POA: Diagnosis not present

## 2017-07-02 DIAGNOSIS — L03116 Cellulitis of left lower limb: Secondary | ICD-10-CM | POA: Diagnosis not present

## 2017-07-02 DIAGNOSIS — E119 Type 2 diabetes mellitus without complications: Secondary | ICD-10-CM | POA: Diagnosis not present

## 2017-07-02 DIAGNOSIS — Z794 Long term (current) use of insulin: Secondary | ICD-10-CM | POA: Diagnosis not present

## 2017-07-02 DIAGNOSIS — D649 Anemia, unspecified: Secondary | ICD-10-CM | POA: Diagnosis not present

## 2017-07-02 DIAGNOSIS — M109 Gout, unspecified: Secondary | ICD-10-CM | POA: Diagnosis not present

## 2017-07-02 DIAGNOSIS — K7581 Nonalcoholic steatohepatitis (NASH): Secondary | ICD-10-CM | POA: Diagnosis not present

## 2017-07-02 NOTE — Patient Outreach (Signed)
[facility]) Care Management  07/02/2017  Roberto Perez 02-26-1951 [id number]  Met with [name], SW at facility. She reports patient will discharge 8/29. She is unsure if he will take home care as he would have a co-pay.  Attempted to meet with patient, he was not in his room x2 attempts.  Plan to try to attempt again before discharge.  [name]. [name], RN, BSN, [place] [phone]) Business Cell  [phone]) Toll Free Office

## 2017-07-09 DIAGNOSIS — K7581 Nonalcoholic steatohepatitis (NASH): Secondary | ICD-10-CM | POA: Diagnosis not present

## 2017-07-09 DIAGNOSIS — L97821 Non-pressure chronic ulcer of other part of left lower leg limited to breakdown of skin: Secondary | ICD-10-CM | POA: Diagnosis not present

## 2017-07-09 DIAGNOSIS — Z87891 Personal history of nicotine dependence: Secondary | ICD-10-CM | POA: Diagnosis not present

## 2017-07-09 DIAGNOSIS — Z794 Long term (current) use of insulin: Secondary | ICD-10-CM | POA: Diagnosis not present

## 2017-07-09 DIAGNOSIS — M109 Gout, unspecified: Secondary | ICD-10-CM | POA: Diagnosis not present

## 2017-07-09 DIAGNOSIS — E1151 Type 2 diabetes mellitus with diabetic peripheral angiopathy without gangrene: Secondary | ICD-10-CM | POA: Diagnosis not present

## 2017-07-09 DIAGNOSIS — L03116 Cellulitis of left lower limb: Secondary | ICD-10-CM | POA: Diagnosis not present

## 2017-07-09 DIAGNOSIS — I83028 Varicose veins of left lower extremity with ulcer other part of lower leg: Secondary | ICD-10-CM | POA: Diagnosis not present

## 2017-07-09 DIAGNOSIS — I890 Lymphedema, not elsewhere classified: Secondary | ICD-10-CM | POA: Diagnosis not present

## 2017-07-09 DIAGNOSIS — E1122 Type 2 diabetes mellitus with diabetic chronic kidney disease: Secondary | ICD-10-CM | POA: Diagnosis not present

## 2017-07-09 DIAGNOSIS — E039 Hypothyroidism, unspecified: Secondary | ICD-10-CM | POA: Diagnosis not present

## 2017-07-09 DIAGNOSIS — N189 Chronic kidney disease, unspecified: Secondary | ICD-10-CM | POA: Diagnosis not present

## 2017-07-09 DIAGNOSIS — K766 Portal hypertension: Secondary | ICD-10-CM | POA: Diagnosis not present

## 2017-07-09 DIAGNOSIS — I129 Hypertensive chronic kidney disease with stage 1 through stage 4 chronic kidney disease, or unspecified chronic kidney disease: Secondary | ICD-10-CM | POA: Diagnosis not present

## 2017-07-09 DIAGNOSIS — I82593 Chronic embolism and thrombosis of other specified deep vein of lower extremity, bilateral: Secondary | ICD-10-CM | POA: Diagnosis not present

## 2017-07-09 DIAGNOSIS — D509 Iron deficiency anemia, unspecified: Secondary | ICD-10-CM | POA: Diagnosis not present

## 2017-07-10 DIAGNOSIS — K7460 Unspecified cirrhosis of liver: Secondary | ICD-10-CM | POA: Diagnosis not present

## 2017-07-10 DIAGNOSIS — K766 Portal hypertension: Secondary | ICD-10-CM | POA: Diagnosis not present

## 2017-07-10 DIAGNOSIS — Z8679 Personal history of other diseases of the circulatory system: Secondary | ICD-10-CM | POA: Diagnosis not present

## 2017-07-10 DIAGNOSIS — N189 Chronic kidney disease, unspecified: Secondary | ICD-10-CM | POA: Diagnosis not present

## 2017-07-10 DIAGNOSIS — E1122 Type 2 diabetes mellitus with diabetic chronic kidney disease: Secondary | ICD-10-CM | POA: Diagnosis not present

## 2017-07-10 DIAGNOSIS — I739 Peripheral vascular disease, unspecified: Secondary | ICD-10-CM | POA: Diagnosis not present

## 2017-07-10 DIAGNOSIS — E039 Hypothyroidism, unspecified: Secondary | ICD-10-CM | POA: Diagnosis not present

## 2017-07-10 DIAGNOSIS — Z6834 Body mass index (BMI) 34.0-34.9, adult: Secondary | ICD-10-CM | POA: Diagnosis not present

## 2017-07-10 DIAGNOSIS — Z86010 Personal history of colonic polyps: Secondary | ICD-10-CM | POA: Diagnosis not present

## 2017-07-10 DIAGNOSIS — E119 Type 2 diabetes mellitus without complications: Secondary | ICD-10-CM | POA: Diagnosis not present

## 2017-07-10 DIAGNOSIS — K7469 Other cirrhosis of liver: Secondary | ICD-10-CM | POA: Diagnosis not present

## 2017-07-10 DIAGNOSIS — L97209 Non-pressure chronic ulcer of unspecified calf with unspecified severity: Secondary | ICD-10-CM | POA: Diagnosis not present

## 2017-07-10 DIAGNOSIS — J90 Pleural effusion, not elsewhere classified: Secondary | ICD-10-CM | POA: Diagnosis not present

## 2017-07-10 DIAGNOSIS — D539 Nutritional anemia, unspecified: Secondary | ICD-10-CM | POA: Diagnosis not present

## 2017-07-10 DIAGNOSIS — Z7984 Long term (current) use of oral hypoglycemic drugs: Secondary | ICD-10-CM | POA: Diagnosis not present

## 2017-07-10 DIAGNOSIS — R0989 Other specified symptoms and signs involving the circulatory and respiratory systems: Secondary | ICD-10-CM | POA: Diagnosis not present

## 2017-07-10 DIAGNOSIS — L89509 Pressure ulcer of unspecified ankle, unspecified stage: Secondary | ICD-10-CM | POA: Diagnosis not present

## 2017-07-10 DIAGNOSIS — R600 Localized edema: Secondary | ICD-10-CM | POA: Diagnosis not present

## 2017-07-10 DIAGNOSIS — I4581 Long QT syndrome: Secondary | ICD-10-CM | POA: Diagnosis not present

## 2017-07-10 DIAGNOSIS — I129 Hypertensive chronic kidney disease with stage 1 through stage 4 chronic kidney disease, or unspecified chronic kidney disease: Secondary | ICD-10-CM | POA: Diagnosis not present

## 2017-07-10 DIAGNOSIS — J9600 Acute respiratory failure, unspecified whether with hypoxia or hypercapnia: Secondary | ICD-10-CM | POA: Diagnosis not present

## 2017-07-10 DIAGNOSIS — E875 Hyperkalemia: Secondary | ICD-10-CM | POA: Diagnosis not present

## 2017-07-10 DIAGNOSIS — R9431 Abnormal electrocardiogram [ECG] [EKG]: Secondary | ICD-10-CM | POA: Diagnosis not present

## 2017-07-10 DIAGNOSIS — I440 Atrioventricular block, first degree: Secondary | ICD-10-CM | POA: Diagnosis not present

## 2017-07-10 DIAGNOSIS — R918 Other nonspecific abnormal finding of lung field: Secondary | ICD-10-CM | POA: Diagnosis not present

## 2017-07-10 DIAGNOSIS — R6883 Chills (without fever): Secondary | ICD-10-CM | POA: Diagnosis not present

## 2017-07-10 DIAGNOSIS — I4510 Unspecified right bundle-branch block: Secondary | ICD-10-CM | POA: Diagnosis not present

## 2017-07-10 DIAGNOSIS — N5089 Other specified disorders of the male genital organs: Secondary | ICD-10-CM | POA: Diagnosis not present

## 2017-07-10 DIAGNOSIS — K7581 Nonalcoholic steatohepatitis (NASH): Secondary | ICD-10-CM | POA: Diagnosis not present

## 2017-07-10 DIAGNOSIS — M109 Gout, unspecified: Secondary | ICD-10-CM | POA: Diagnosis not present

## 2017-07-10 DIAGNOSIS — I82503 Chronic embolism and thrombosis of unspecified deep veins of lower extremity, bilateral: Secondary | ICD-10-CM | POA: Diagnosis not present

## 2017-07-10 DIAGNOSIS — N179 Acute kidney failure, unspecified: Secondary | ICD-10-CM | POA: Diagnosis not present

## 2017-07-10 DIAGNOSIS — R188 Other ascites: Secondary | ICD-10-CM | POA: Diagnosis not present

## 2017-07-16 ENCOUNTER — Other Ambulatory Visit: Payer: Self-pay | Admitting: *Deleted

## 2017-07-16 DIAGNOSIS — Z6834 Body mass index (BMI) 34.0-34.9, adult: Secondary | ICD-10-CM | POA: Diagnosis not present

## 2017-07-16 DIAGNOSIS — K7460 Unspecified cirrhosis of liver: Secondary | ICD-10-CM | POA: Diagnosis not present

## 2017-07-16 NOTE — Patient Outreach (Signed)
Unsuccessful telephone encounter to Donzetta Starch, 66 year old male, follow up on HTA member assigned to this RN CM ([facility] inpatient discharge), pt discharged from [facility] 07/08/17 to home, attempt call to assess pt for needs post discharge.   First home number called was for a business, did not leave a voice message.   Called second home phone listed, HIPAA compliant voice message left with contact name and number.    Plan:  If no response to voice message left, plan to follow up again tomorrow telephonically.    Zara Chess.   [place] Care Management  [phone]

## 2017-07-17 ENCOUNTER — Other Ambulatory Visit: Payer: Self-pay | Admitting: *Deleted

## 2017-07-17 NOTE — Patient Outreach (Signed)
Second unsuccessful telephone encounter to Roberto Perez, 66 year old male, followed up on HTA member assigned to RN CM ([facility] inpatient discharge), informed pt discharged from [facility] 07/08/17 to home, another attempt call to assess pt for needs post discharge.  Pt has a history but not limited to Encephalopathy,Ascites, End stage liver disease, bacteremia.   View in Epic/care everywhere - recent hospitalizations 8/12-8/16/18 acute kidney injury,cellulitis of left lower extremity,anemia, nonalcoholic fatty liver disease), 8/31-9/02/18 for acute renal failure.   HIPAA compliant voice message left with contact name and number.   Plan:  If no response to voice message left, plan to follow up again next business day next week.    Zara Chess.   [place] Care Management  [phone]

## 2017-07-20 ENCOUNTER — Other Ambulatory Visit: Payer: Self-pay | Admitting: *Deleted

## 2017-07-20 ENCOUNTER — Ambulatory Visit: Payer: Self-pay | Admitting: *Deleted

## 2017-07-20 NOTE — Patient Outreach (Signed)
Received a return phone call from pt's brother Delaney Meigs (listed in [place] as emergency contact), reports pt is sleeping now.   Brother able to provide pt's name but could not remember his date of birth.    RN CM informed brother  from [facility], requested to have pt  return call once awake to which brother agreed/brother has contact number.    Plan:  RN CM to wait for return call from pt - follow up on referral (HTA member- recent SNF discharge).     Zara Chess.   [place] Care Management  [phone]

## 2017-07-20 NOTE — Patient Outreach (Signed)
Third unsuccessful telephone encounter to Roberto Perez, 66 year old male, follow up on [name] member assigned to RN CM [facility] Team inpatient discharge), informed pt discharged from [facility] 07/08/17 to home, attempt to assess needs post discharge.  Pt has a history but not limited to Encephalopathy, Ascites, End stage liver disease, bacteremia. HIPAA compliant voice message with contact name and number/request to return call.    Plan:  If no response to voice message left with this being the third attempt, unable to contact letter to be sent to pt.    Zara Chess.   [place] Care Management  [phone]

## 2017-07-20 NOTE — Patient Outreach (Signed)
Received a return phone call from Roberto Perez, 66 year old male, HIPAA identifiers verified.  RN CM discussed with pt  referral received being a [employer] member/follow up on  recent SNF discharge.  Also discussed with pt  [facility] CM services/benefit of his insurance- no cost/follow pt for transition of care (weekly calls, a home visit) to which pt reports already have a Home health nurse/physical therapist, not interested.   RN CM discussed with pt the difference between home health and [facility] CM management, can receive both services to which pt still declined.    Plan:   RN CM to inform [facility] assistant care management pt declined services- close case.   Zara Chess.   [place] Care Management  [phone]

## 2017-07-27 DIAGNOSIS — I890 Lymphedema, not elsewhere classified: Secondary | ICD-10-CM | POA: Diagnosis not present

## 2017-07-27 DIAGNOSIS — I872 Venous insufficiency (chronic) (peripheral): Secondary | ICD-10-CM | POA: Diagnosis not present

## 2017-07-27 DIAGNOSIS — L97221 Non-pressure chronic ulcer of left calf limited to breakdown of skin: Secondary | ICD-10-CM | POA: Diagnosis not present

## 2017-08-02 DIAGNOSIS — K7460 Unspecified cirrhosis of liver: Secondary | ICD-10-CM | POA: Diagnosis not present

## 2017-08-02 DIAGNOSIS — D509 Iron deficiency anemia, unspecified: Secondary | ICD-10-CM | POA: Diagnosis not present

## 2017-08-02 DIAGNOSIS — R188 Other ascites: Secondary | ICD-10-CM | POA: Diagnosis not present

## 2017-08-02 DIAGNOSIS — R0602 Shortness of breath: Secondary | ICD-10-CM | POA: Diagnosis not present

## 2017-08-07 DIAGNOSIS — D509 Iron deficiency anemia, unspecified: Secondary | ICD-10-CM | POA: Diagnosis not present

## 2017-08-07 DIAGNOSIS — R188 Other ascites: Secondary | ICD-10-CM | POA: Diagnosis not present

## 2017-08-07 DIAGNOSIS — K7460 Unspecified cirrhosis of liver: Secondary | ICD-10-CM | POA: Diagnosis not present

## 2017-08-07 DIAGNOSIS — R0602 Shortness of breath: Secondary | ICD-10-CM | POA: Diagnosis not present

## 2017-08-24 DIAGNOSIS — I872 Venous insufficiency (chronic) (peripheral): Secondary | ICD-10-CM | POA: Diagnosis not present

## 2017-08-24 DIAGNOSIS — L97221 Non-pressure chronic ulcer of left calf limited to breakdown of skin: Secondary | ICD-10-CM | POA: Diagnosis not present

## 2017-08-24 DIAGNOSIS — I890 Lymphedema, not elsewhere classified: Secondary | ICD-10-CM | POA: Diagnosis not present

## 2017-08-24 DIAGNOSIS — Z6834 Body mass index (BMI) 34.0-34.9, adult: Secondary | ICD-10-CM | POA: Diagnosis not present

## 2017-08-28 DIAGNOSIS — L97821 Non-pressure chronic ulcer of other part of left lower leg limited to breakdown of skin: Secondary | ICD-10-CM | POA: Diagnosis not present

## 2017-09-06 DIAGNOSIS — D509 Iron deficiency anemia, unspecified: Secondary | ICD-10-CM | POA: Diagnosis not present

## 2017-09-06 DIAGNOSIS — K7460 Unspecified cirrhosis of liver: Secondary | ICD-10-CM | POA: Diagnosis not present

## 2017-09-06 DIAGNOSIS — R188 Other ascites: Secondary | ICD-10-CM | POA: Diagnosis not present

## 2017-09-06 DIAGNOSIS — R0602 Shortness of breath: Secondary | ICD-10-CM | POA: Diagnosis not present

## 2017-09-07 DIAGNOSIS — L97821 Non-pressure chronic ulcer of other part of left lower leg limited to breakdown of skin: Secondary | ICD-10-CM | POA: Diagnosis not present

## 2017-09-10 DEATH — deceased

## 2017-10-27 IMAGING — US US PARACENTESIS
1 series · 7 of 7 positions shown · non-contrast
Comparison: none

INDICATION: Ascites.

[Series 1: us paracentesis · 0.26mm/px · 7 of 7 slices shown]
[im 1/7]
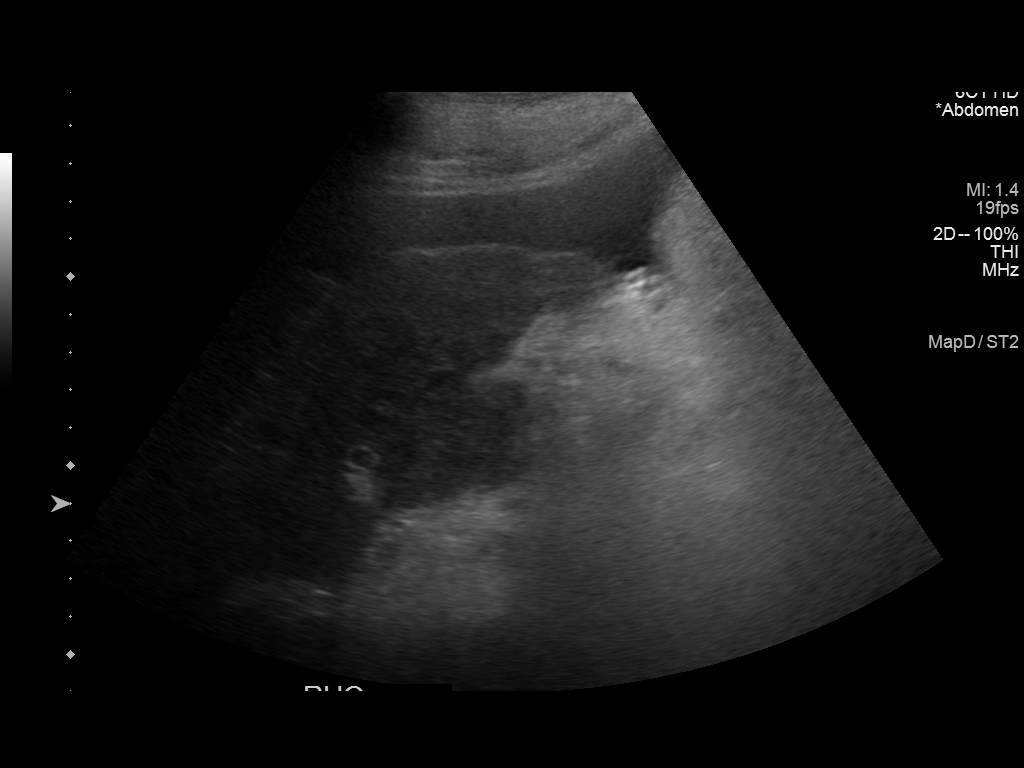
[im 2/7]
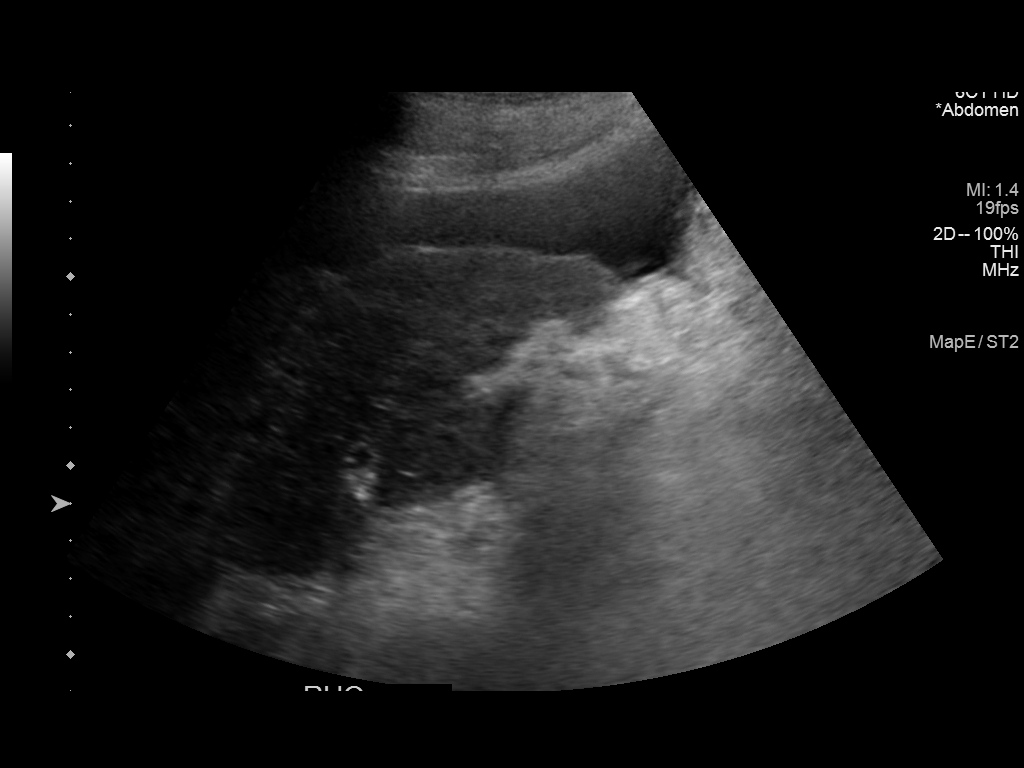
[im 3/7]
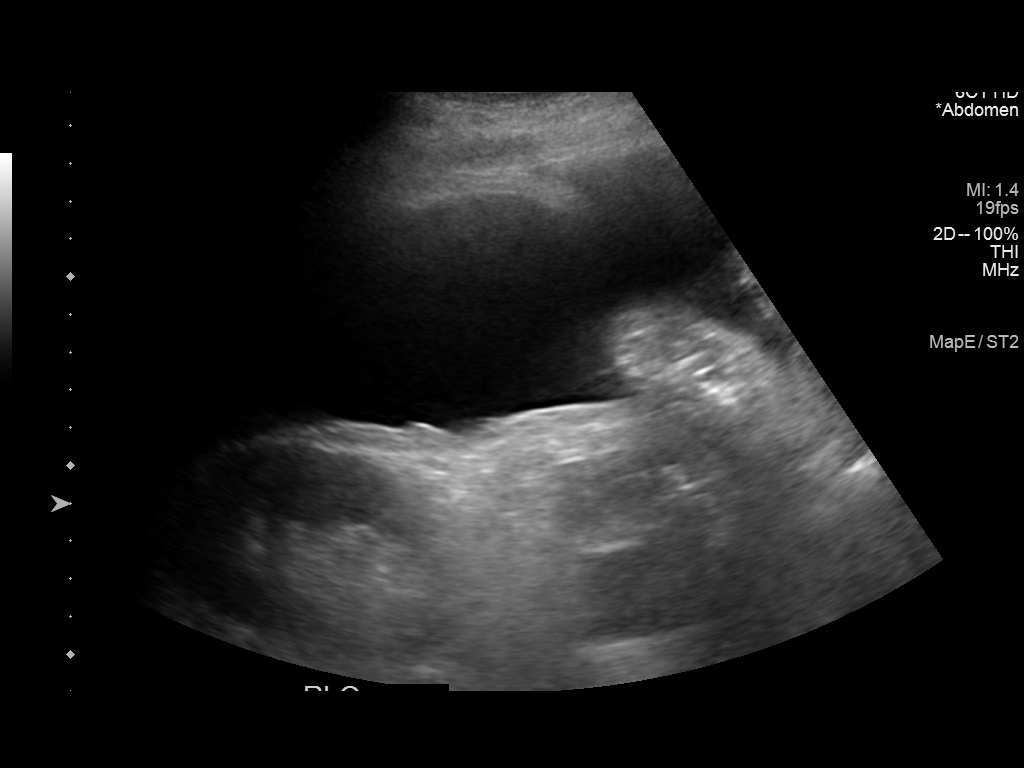
[im 4/7]
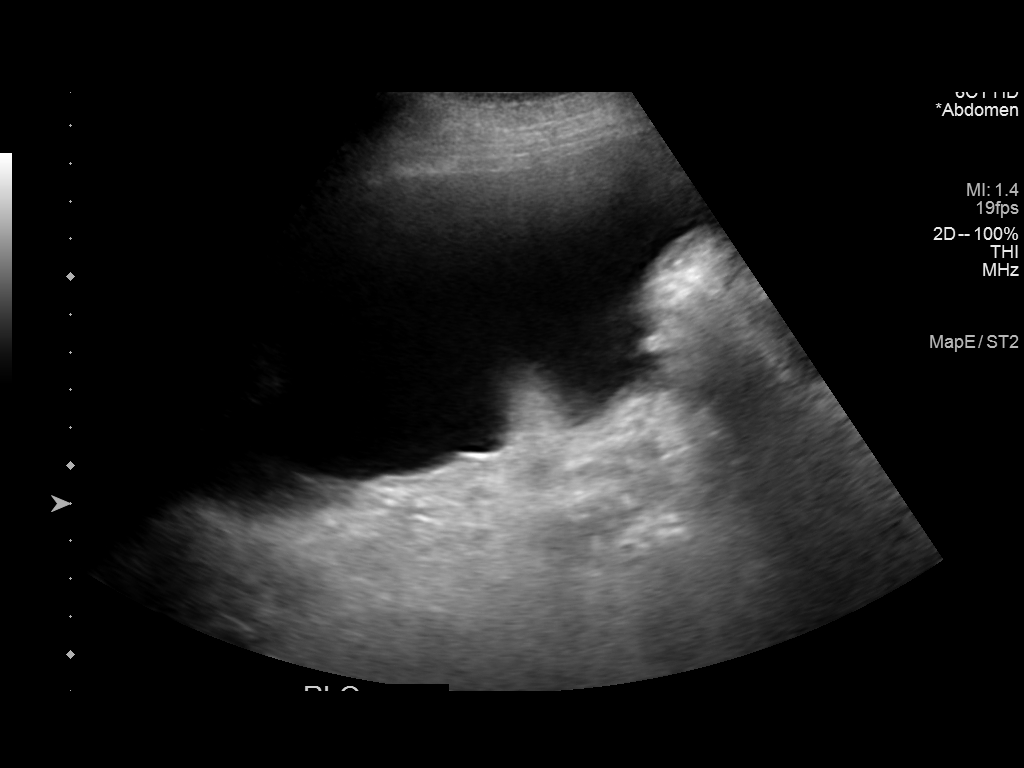
[im 5/7]
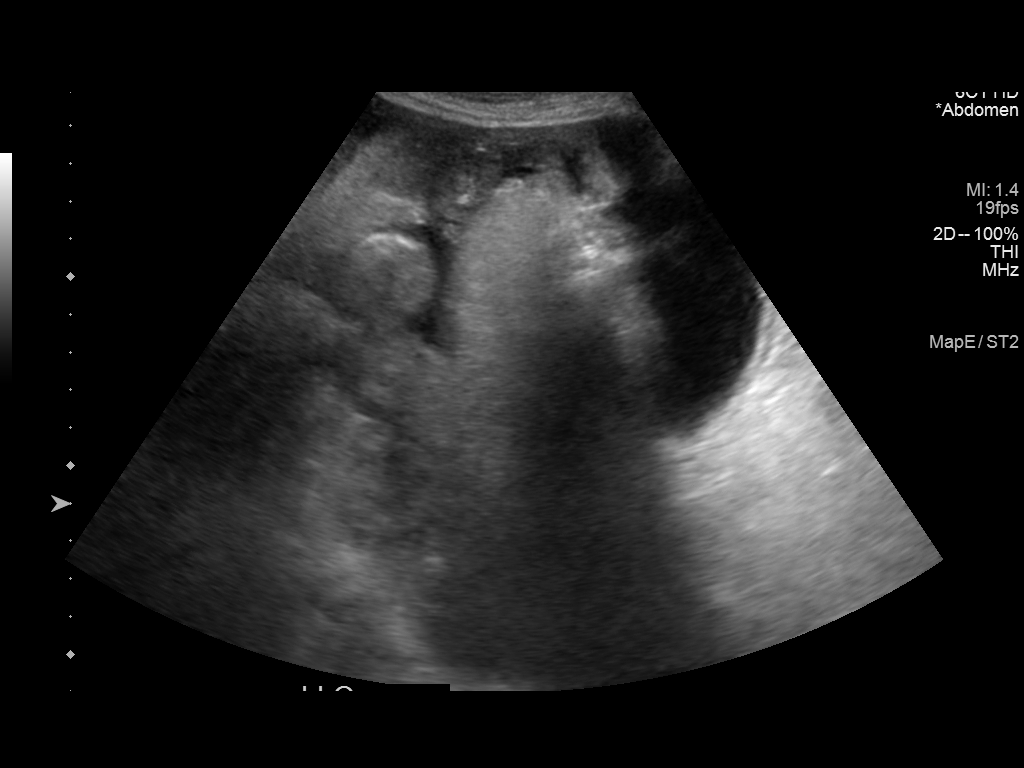
[im 6/7]
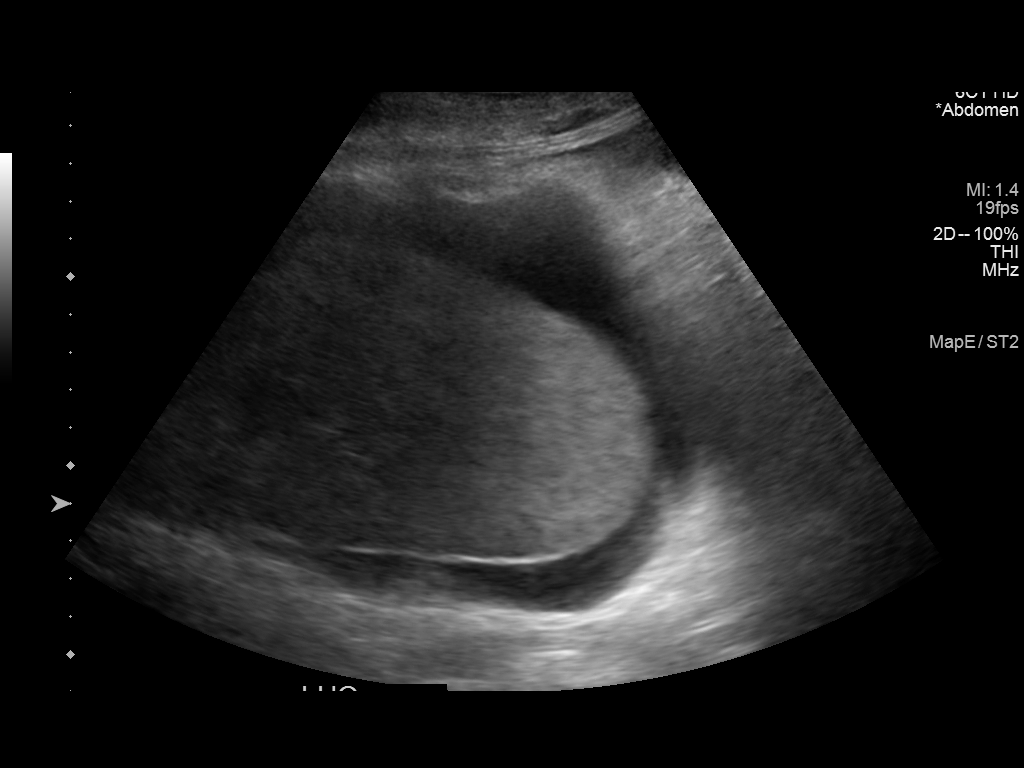
[im 7/7]
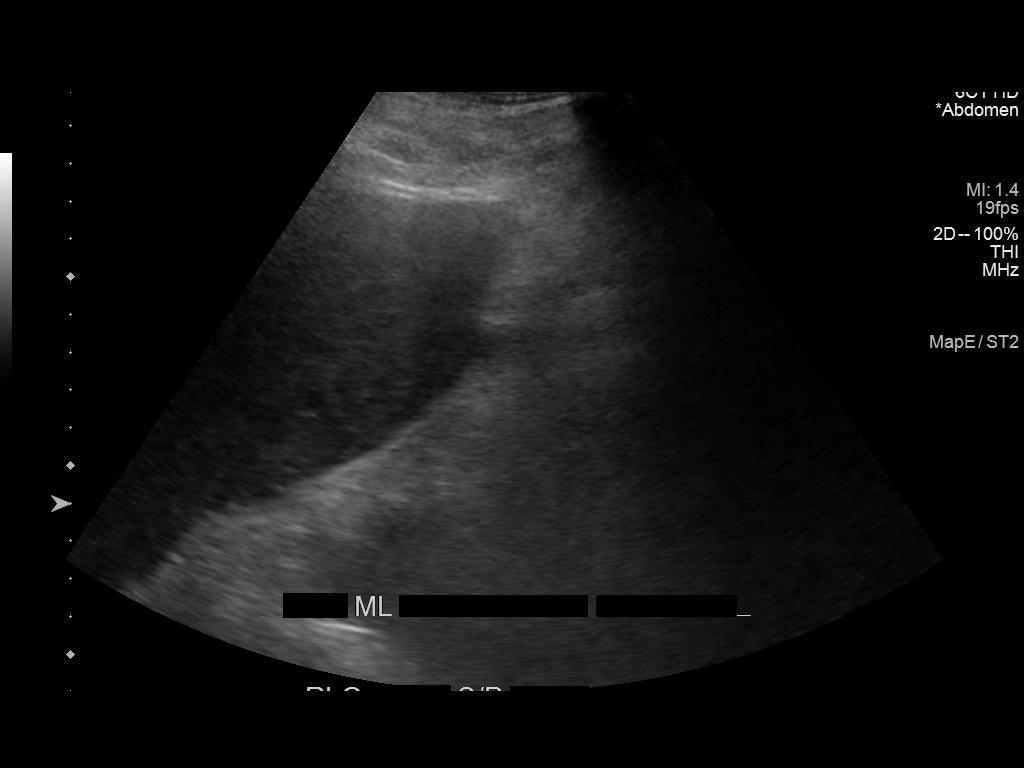

[7 of 7 positions shown; findings below may reference images not displayed]

EXAM:
ULTRASOUND GUIDED therapeutic and diagnostic PARACENTESIS

MEDICATIONS:
None.

COMPLICATIONS:
None immediate.

PROCEDURE:
Informed written consent was obtained from the patient after a
discussion of the risks, benefits and alternatives to treatment. A
timeout was performed prior to the initiation of the procedure.

Initial ultrasound scanning demonstrates a large amount of ascites
within the right lower abdominal quadrant. The right lower abdomen
was prepped and draped in the usual sterile fashion. 1% lidocaine
with epinephrine was used for local anesthesia.

Following this, a Safe-T-Centesis catheter was introduced. An
ultrasound image was saved for documentation purposes. The
paracentesis was performed. The catheter was removed and a dressing
was applied. The patient tolerated the procedure well without
immediate post procedural complication.
FINDINGS: A total of approximately 3 L of serous fluid was removed. Samples
were sent to the laboratory as requested by the clinical team.
IMPRESSION: Successful ultrasound-guided paracentesis yielding 3 liters of
peritoneal fluid.

## 2017-10-30 IMAGING — US US ABDOMEN LIMITED
1 series · 7 of 7 positions shown · non-contrast
Comparison: 02/13/2017

CLINICAL DATA: Ascites and status post paracentesis on 02/13/2017.
Further abdominal distention.

EXAM:
LIMITED ABDOMEN ULTRASOUND FOR ASCITES
TECHNIQUE: Limited ultrasound survey for ascites was performed in all four
abdominal quadrants.

[Series 1: us abdomen limited · 0.26mm/px · 7 of 7 slices shown]
[im 1/7]
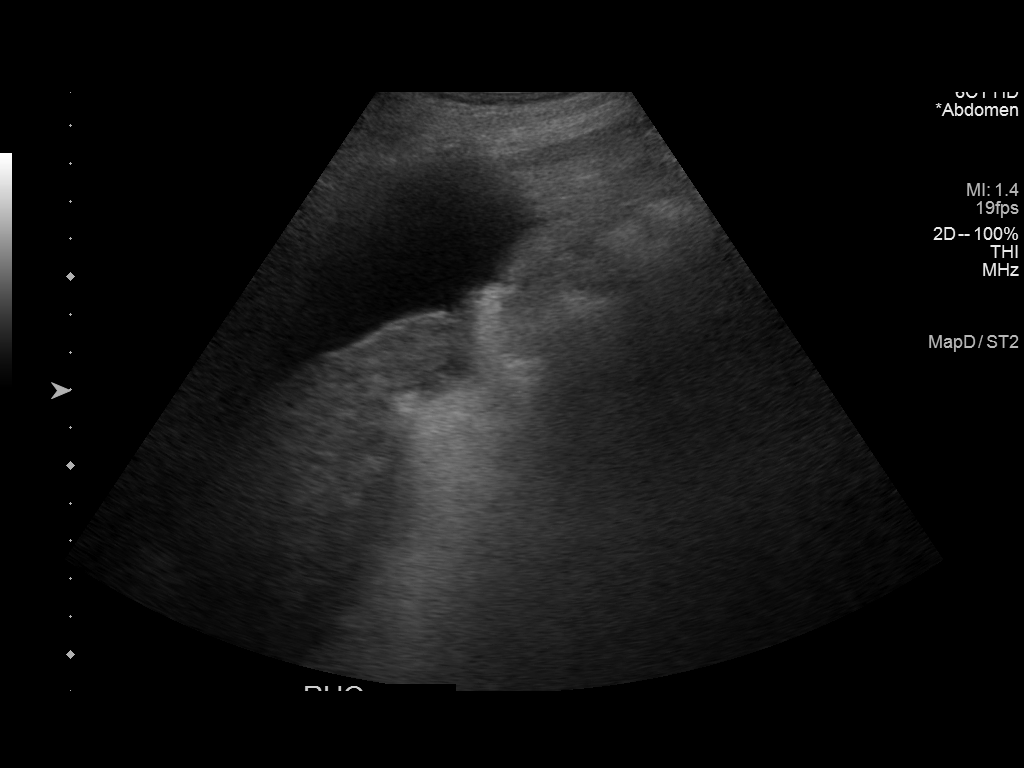
[im 2/7]
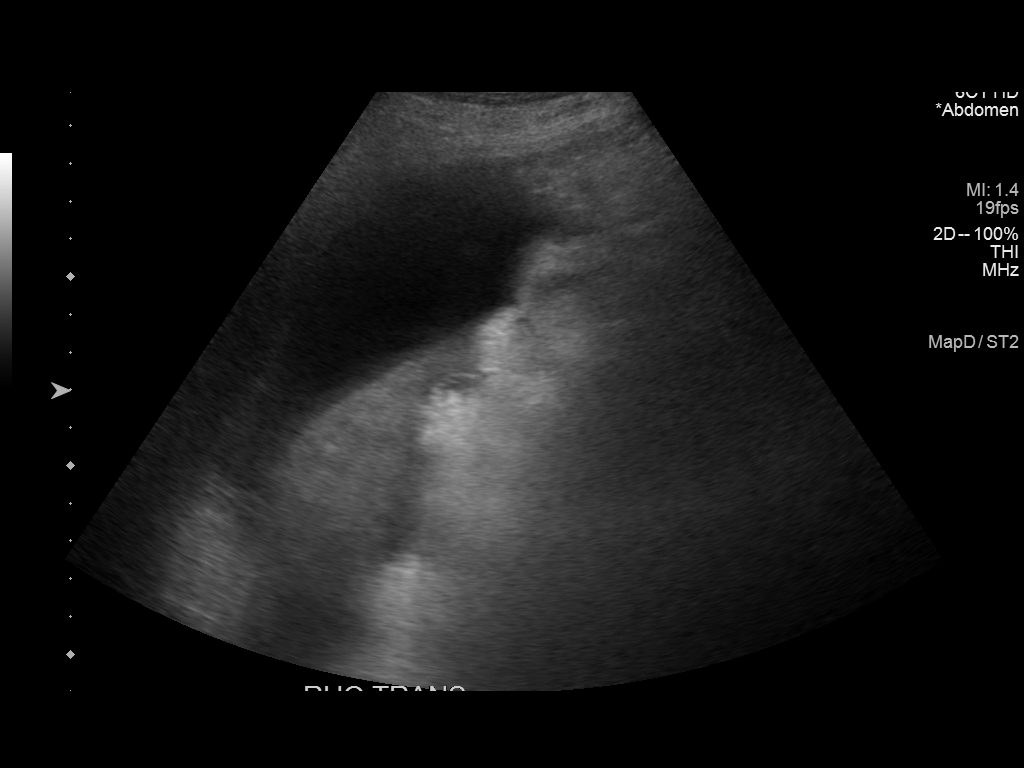
[im 3/7]
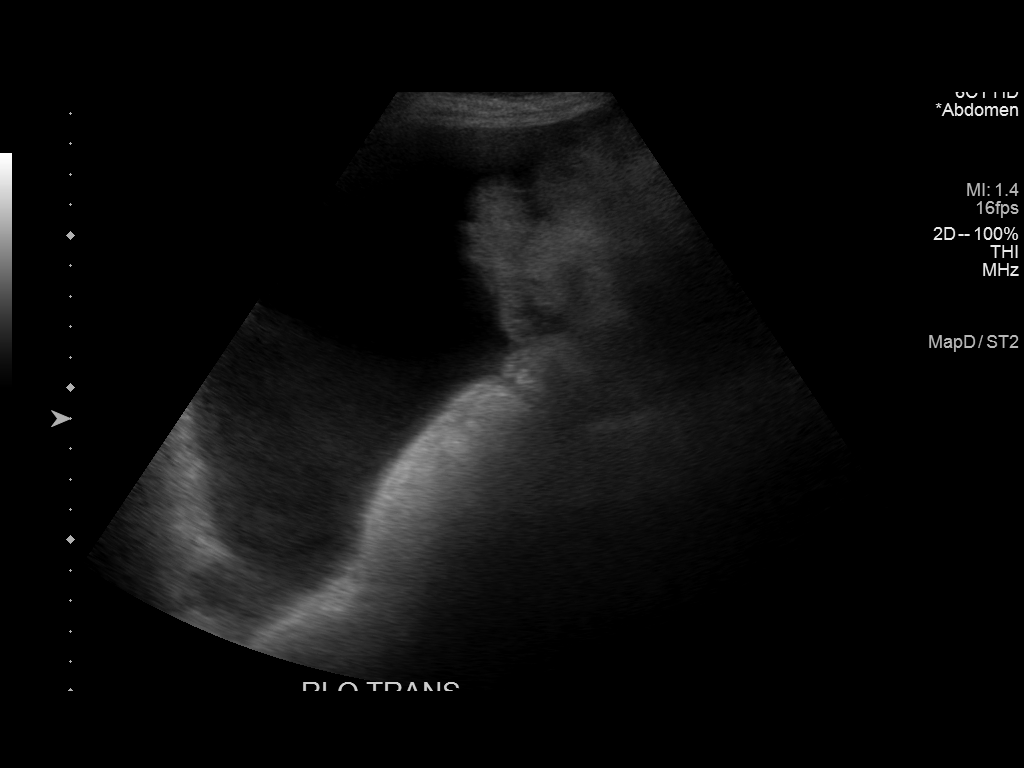
[im 4/7]
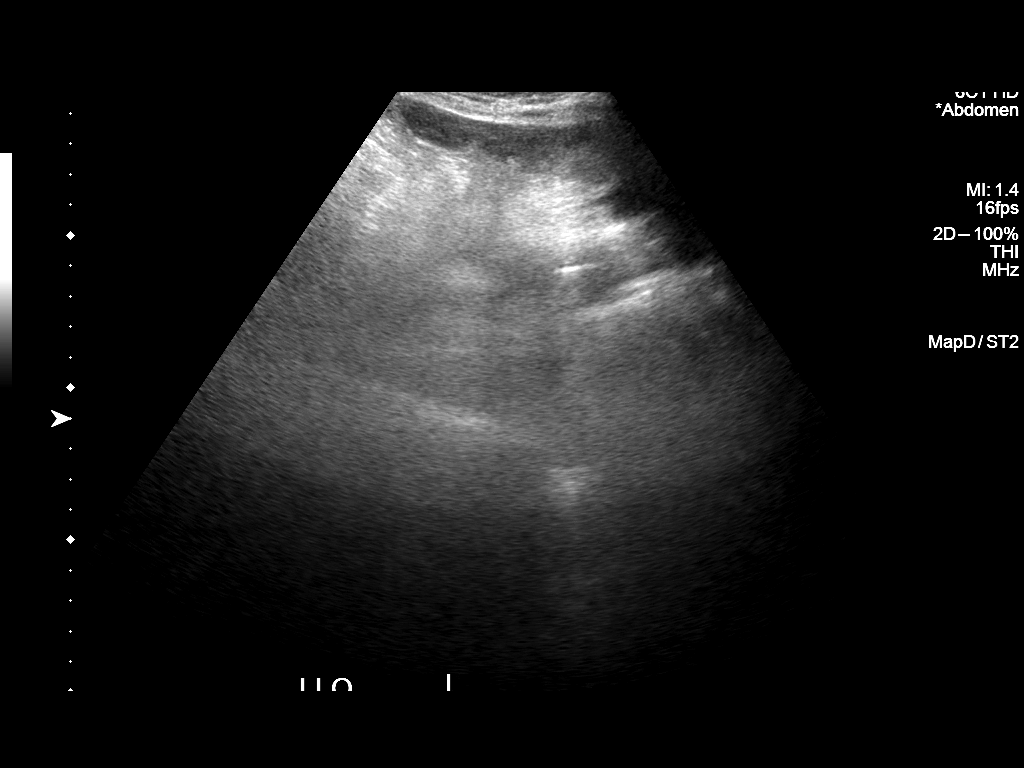
[im 5/7]
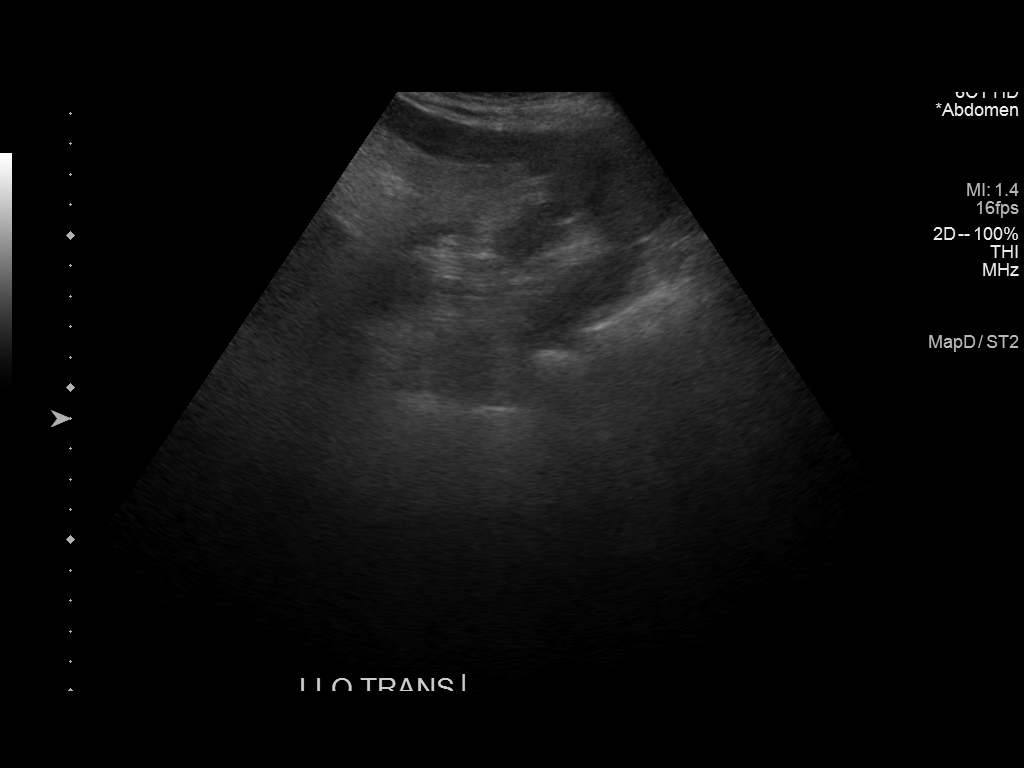
[im 6/7]
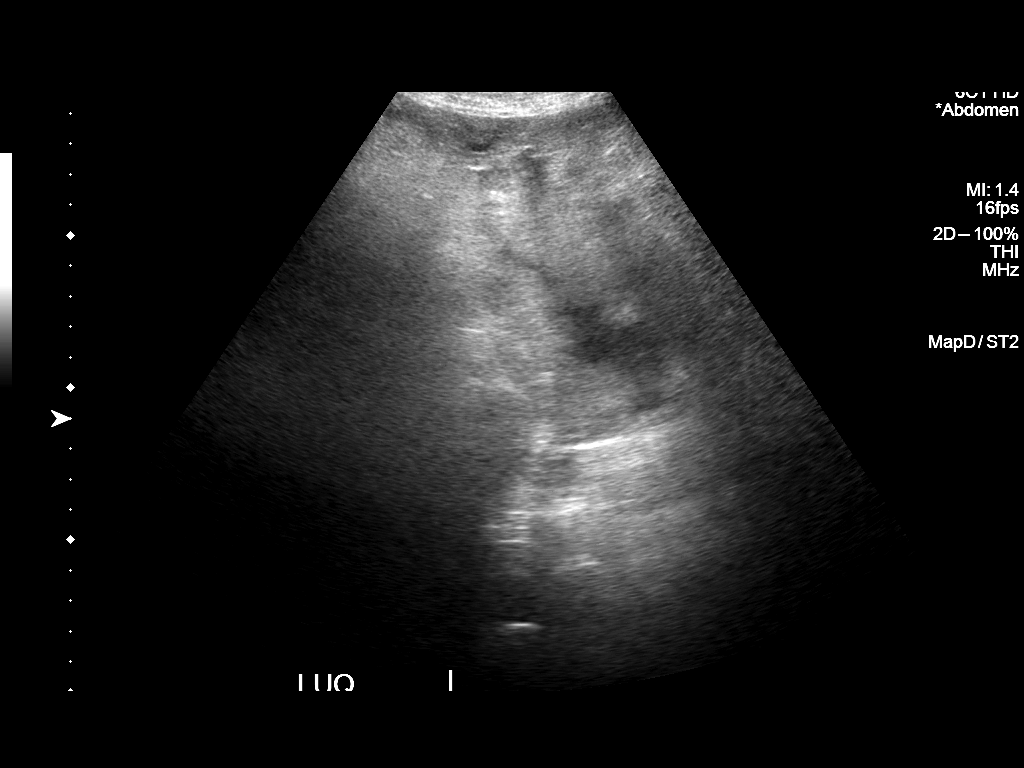
[im 7/7]
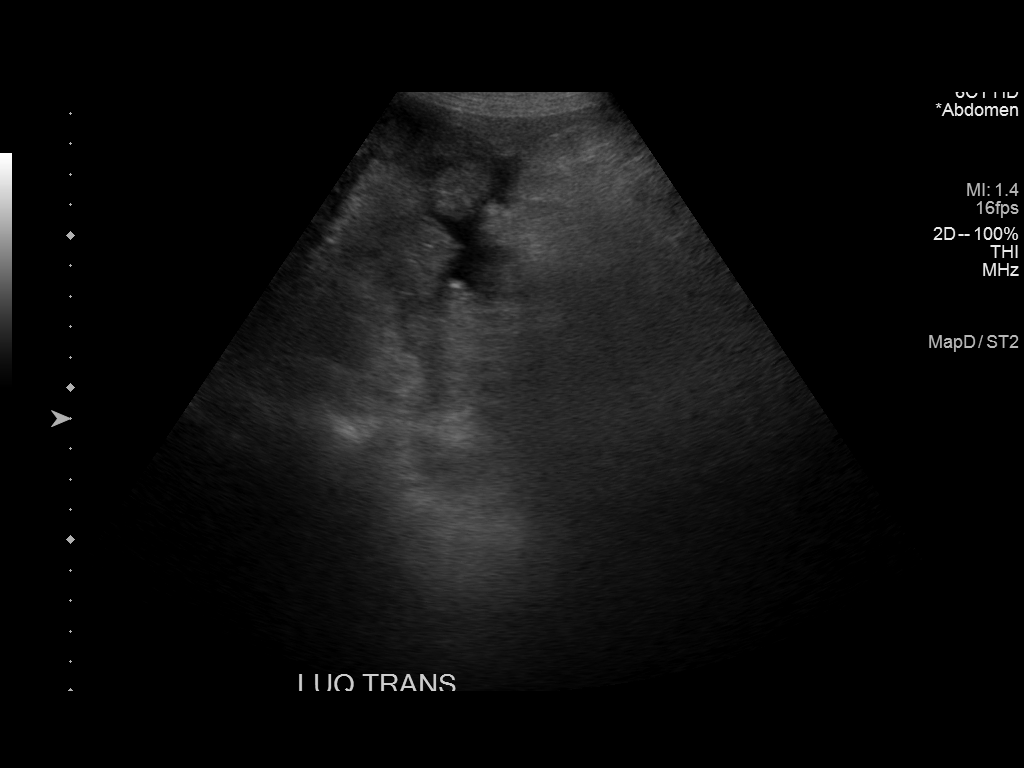

[7 of 7 positions shown; findings below may reference images not displayed]

FINDINGS: There is some scattered ascites throughout the peritoneal cavity. A
large amount of fluid has not re-accumulated since paracentesis and
repeat paracentesis was not performed today.
IMPRESSION: Some ascites remains present in the peritoneal cavity. No
significant reaccumulation since paracentesis 3 days ago.

## 2017-11-02 IMAGING — US US RENAL
1 series · 14 of 25 positions shown · non-contrast
Comparison: None.

CLINICAL DATA: Acute renal failure. Ascites, status post
paracentesis today.

EXAM:
RENAL / URINARY TRACT ULTRASOUND COMPLETE

[Series 1: us renal · 0.28mm/px · 14 of 30 slices shown]
[im 1/30]
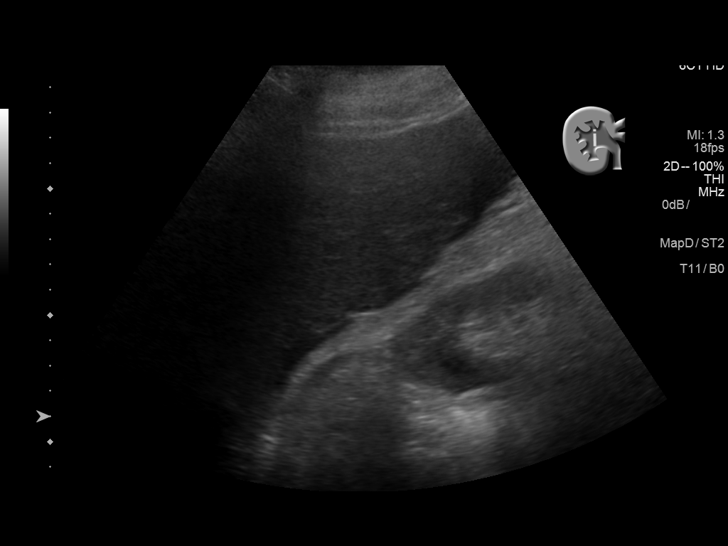
[im 3/30]
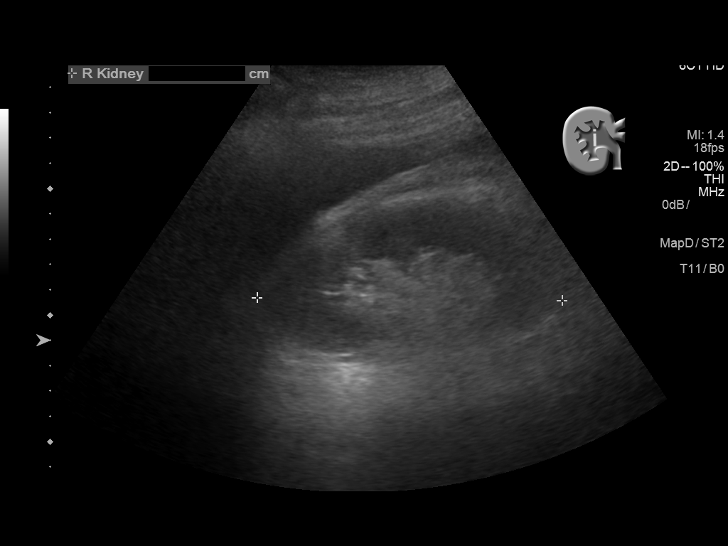
[im 5/30]
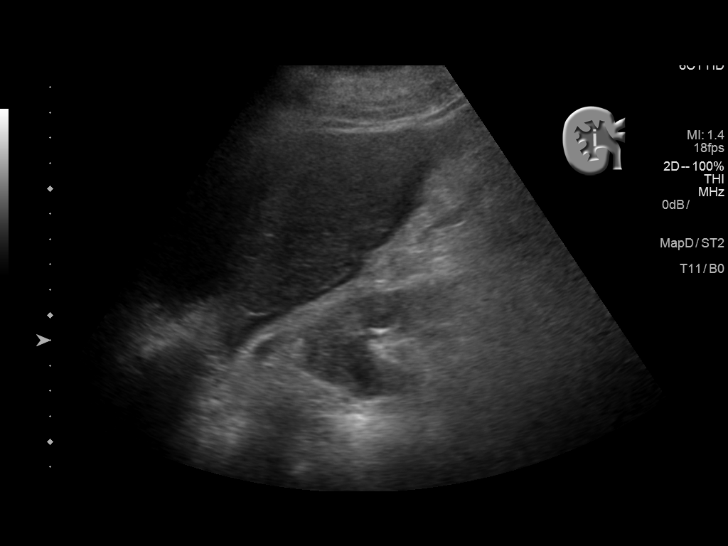
[im 8/30]
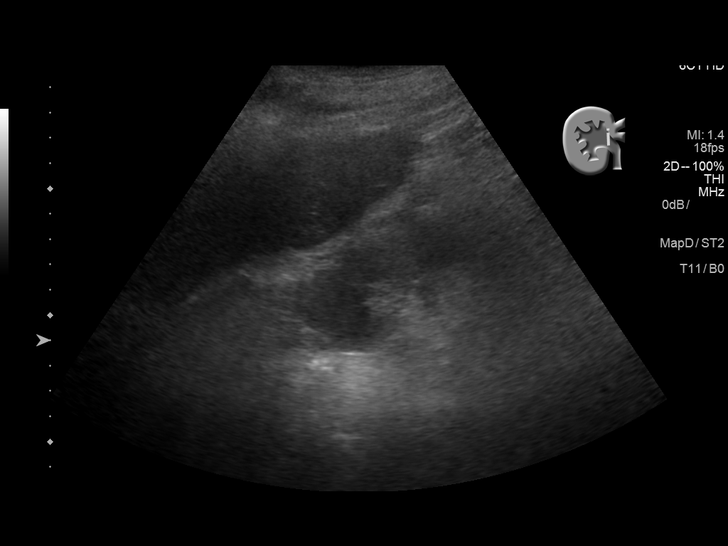
[im 10/30]
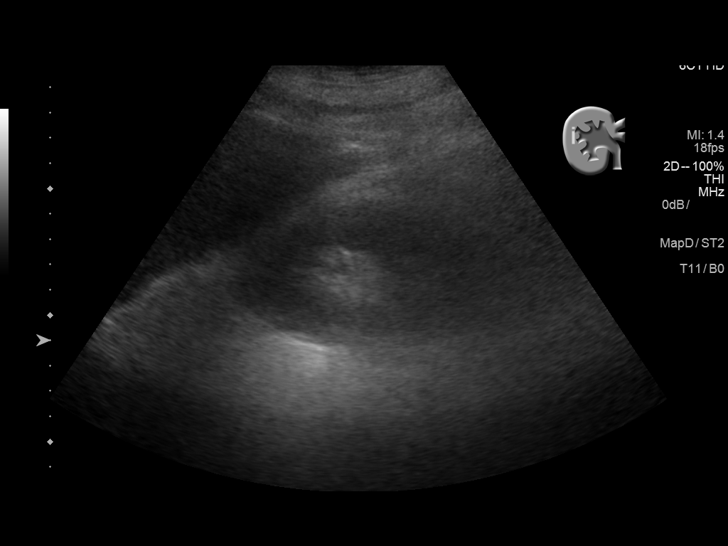
[im 11/30]
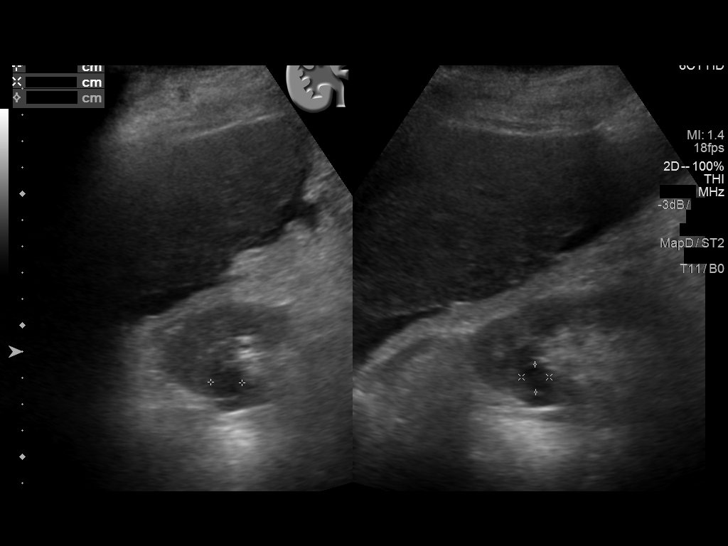
[im 14/30]
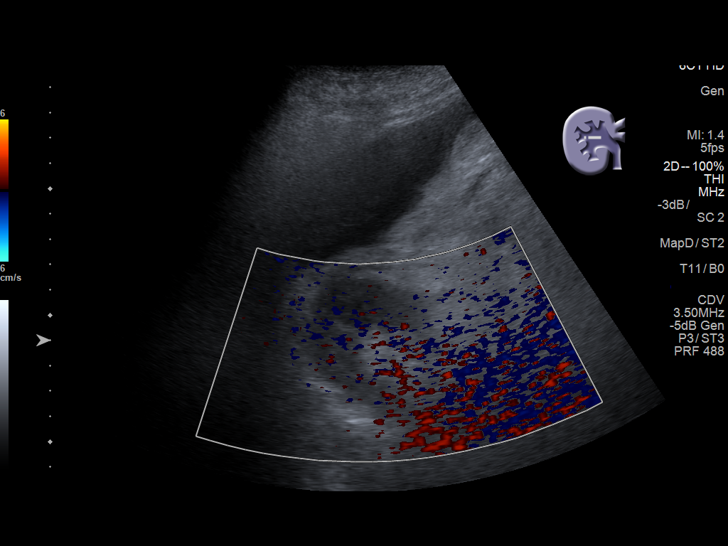
[im 16/30]
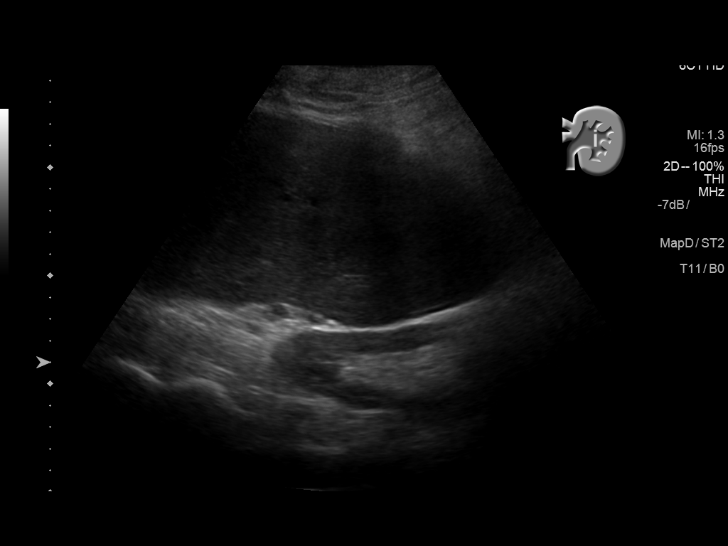
[im 19/30]
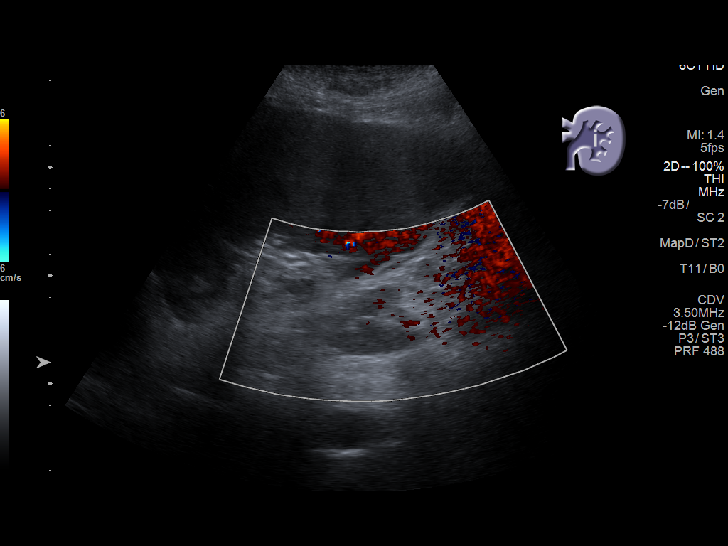
[im 20/30]
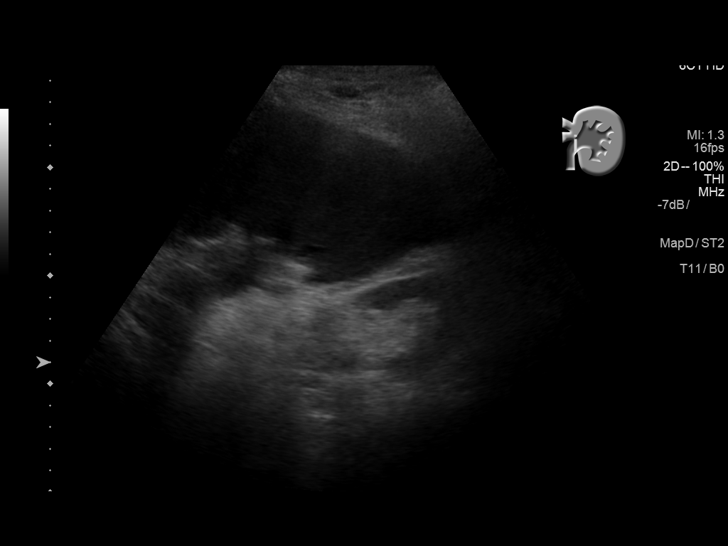
[im 22/30]
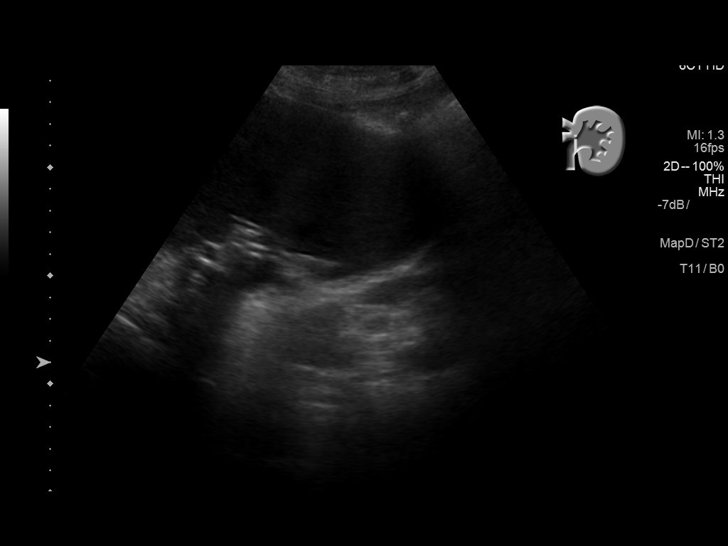
[im 25/30]
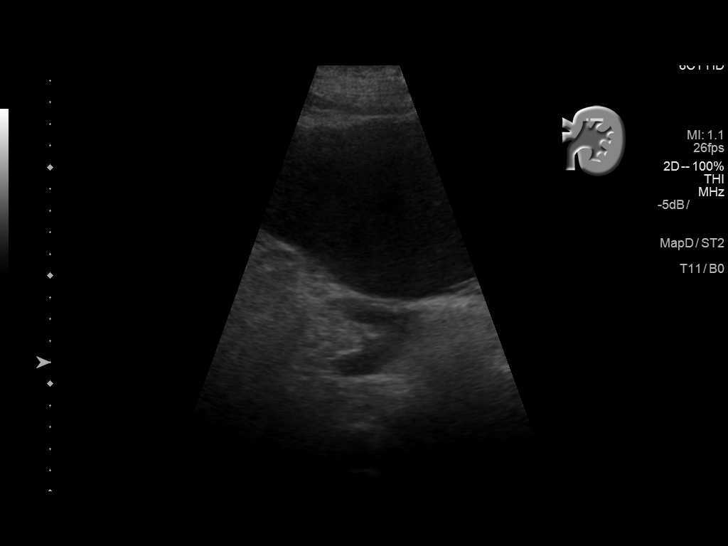
[im 27/30]
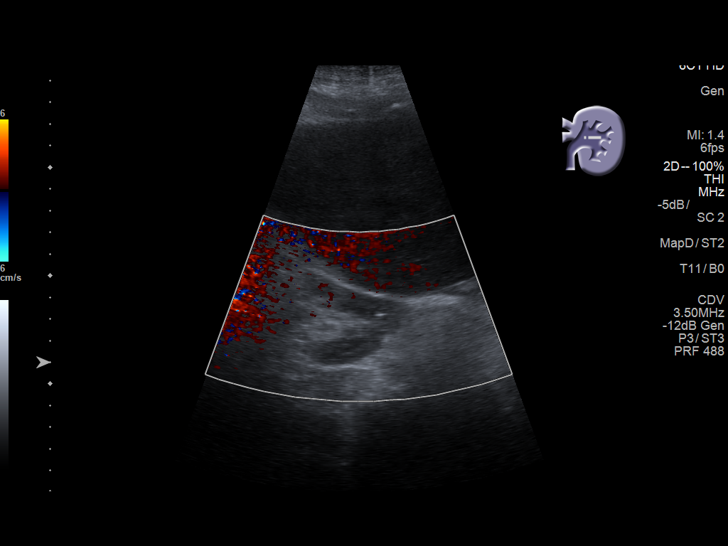
[im 30/30]
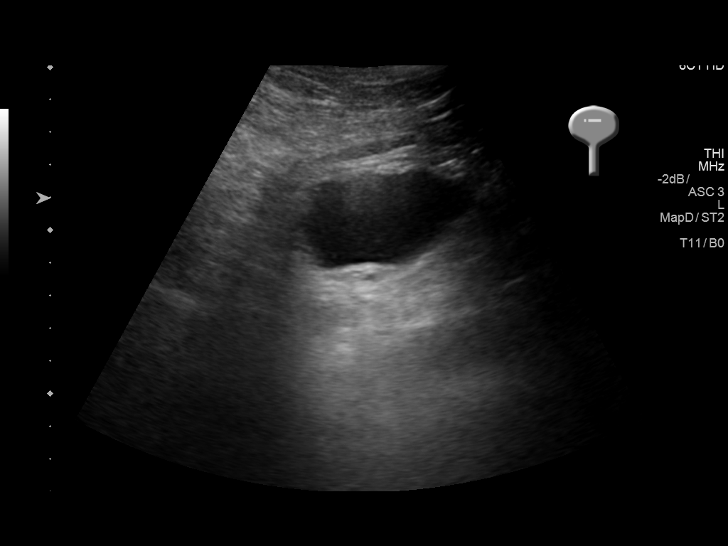

[14 of 25 positions shown; findings below may reference images not displayed]

FINDINGS: Right Kidney:

Length: 12.1 cm. Mildly increased cortical echogenicity. No mass or
hydronephrosis visualized.

Left Kidney:

Length: 10.2 Cm. Mildly increased cortical echogenicity. No mass or
hydronephrosis visualized.

Bladder:

Appears normal for degree of bladder distention.
IMPRESSION: Echogenic kidneys compatible with medical renal disease

## 2017-11-05 IMAGING — US US EXTREM LOW VENOUS BILAT
1 series · 12 of 24 positions shown · non-contrast
Comparison: None.

CLINICAL DATA: Lower extremity swelling x1 week



[Series 1: us extrem low venous bilat · 0.10mm/px · 12 of 68 slices shown]
[im 3/68]
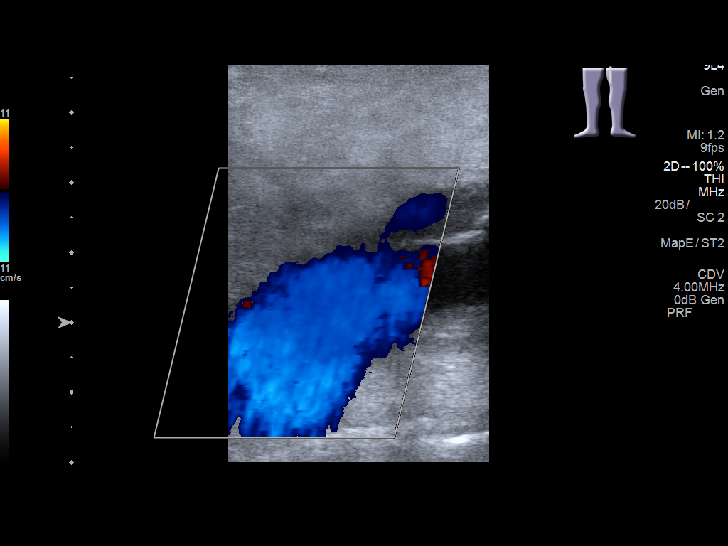
[im 9/68]
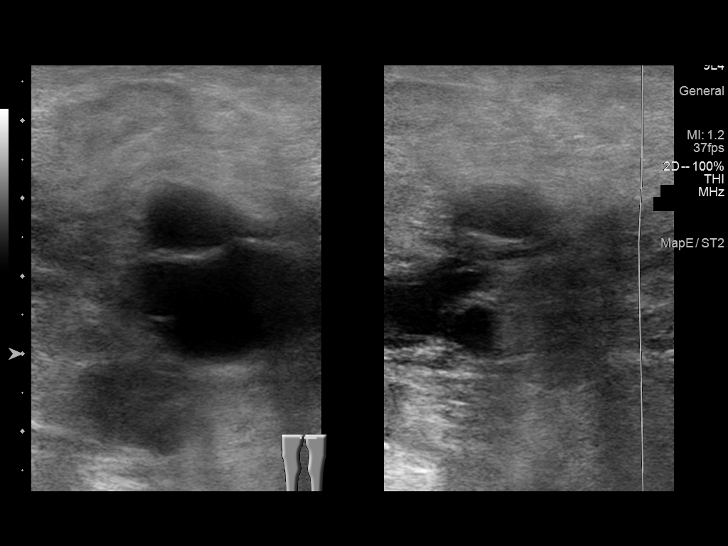
[im 15/68]
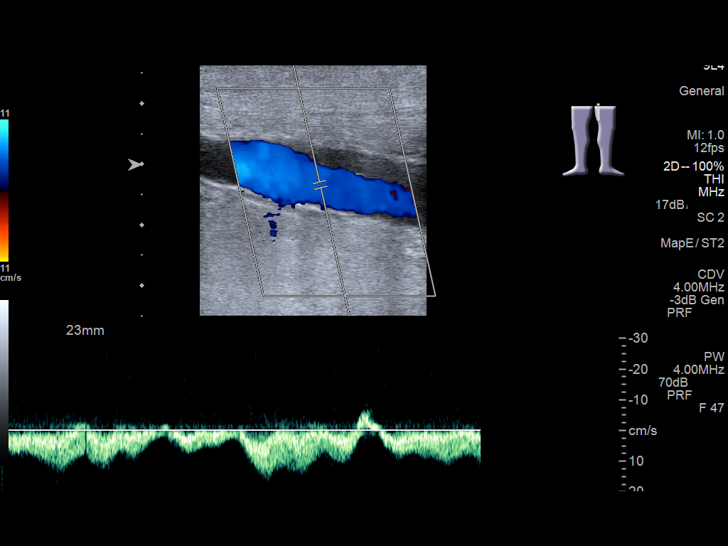
[im 21/68]
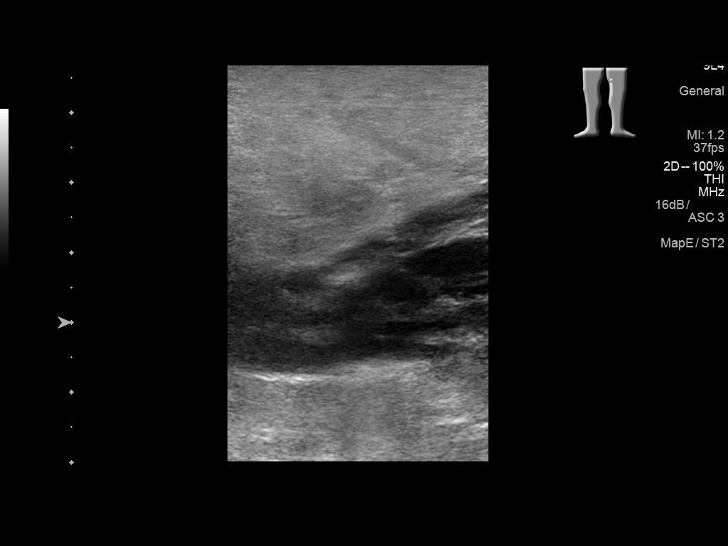
[im 27/68]
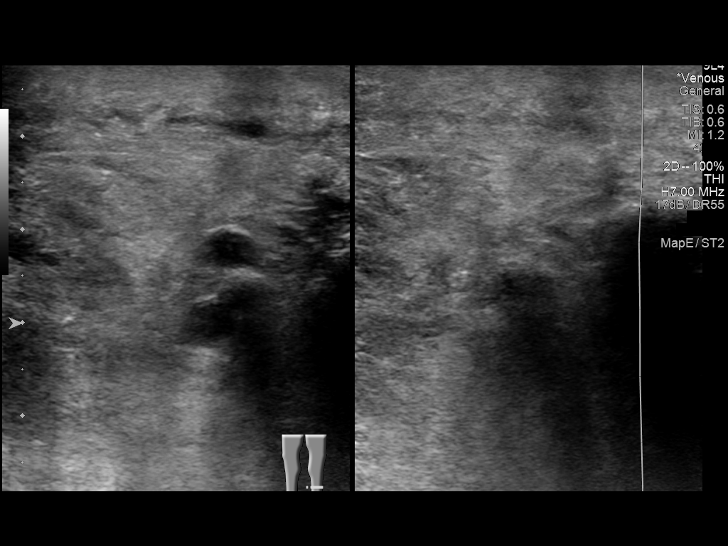
[im 33/68]
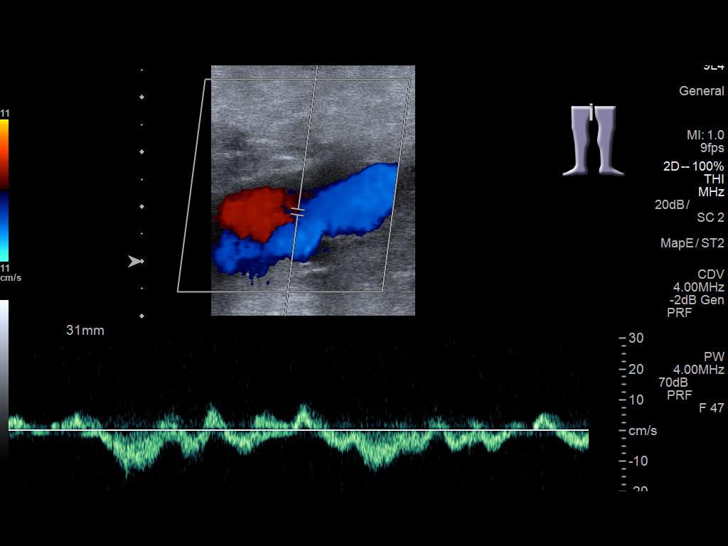
[im 38/68]
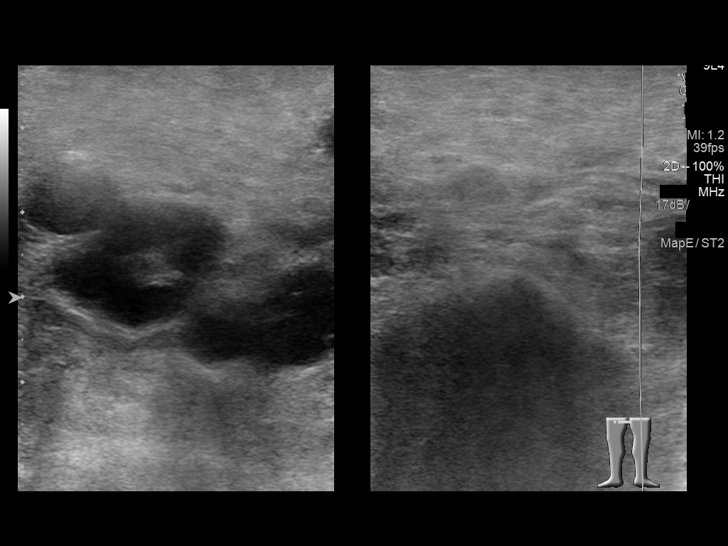
[im 44/68]
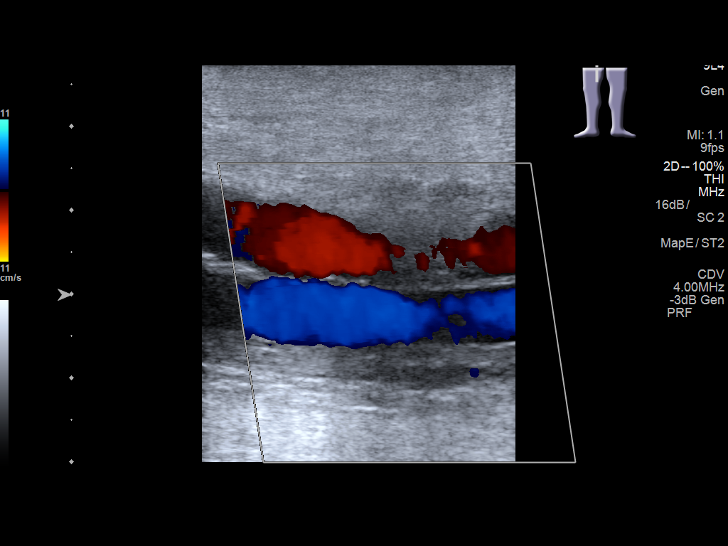
[im 50/68]
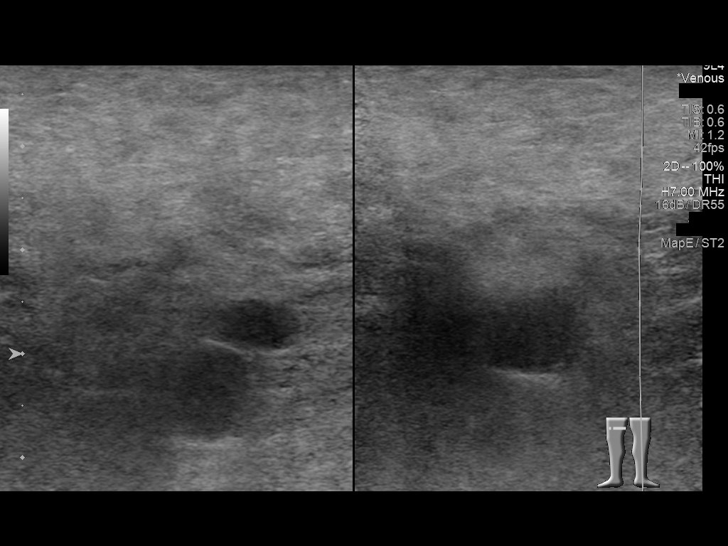
[im 56/68]
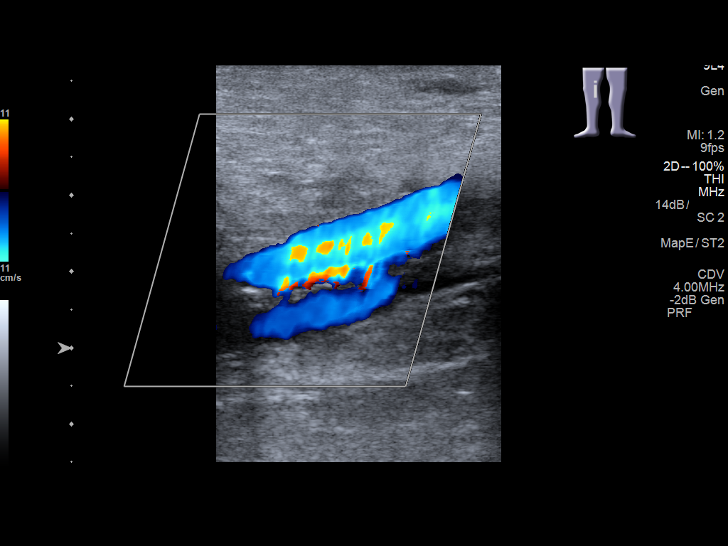
[im 62/68]
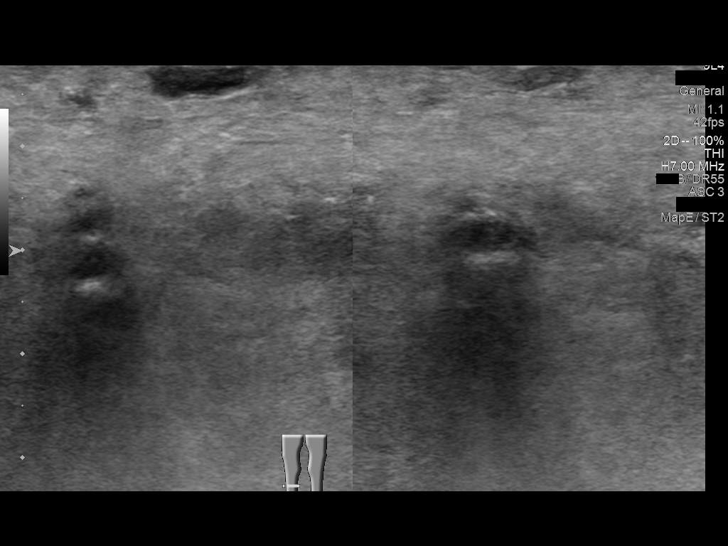
[im 68/68]
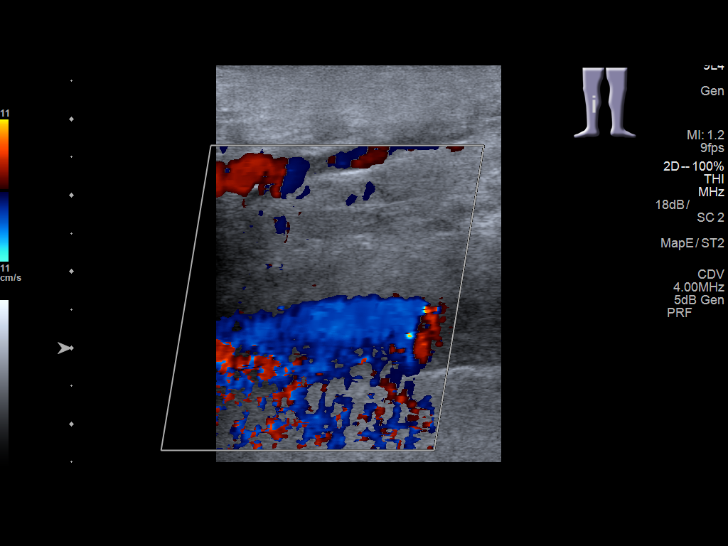

[12 of 24 positions shown; findings below may reference images not displayed]

FINDINGS: RIGHT LOWER EXTREMITY

Common Femoral Vein: No evidence of thrombus. Normal
compressibility, respiratory phasicity and response to augmentation.

Saphenofemoral Junction: No evidence of thrombus. Normal
compressibility and flow on color Doppler imaging.

Profunda Femoral Vein: No evidence of thrombus. Normal
compressibility and flow on color Doppler imaging.

Femoral Vein: Nonocclusive echogenic thrombus from distal to
proximal. Partial compressibility with demonstrable color Doppler
and venous waveforms demonstrating respiratory phasicity.

Popliteal Vein: Echogenic nonocclusive thrombus also noted in the
popliteal vein with color Doppler flow and respiratory phasicity.
The vein is partially compressible.

Calf Veins: Echogenic nonocclusive thrombus in the posterior tibial
vein with color Doppler flow demonstrated within. The peroneal veins
are not well visualized and thrombus within is not entirely
excluded.

Superficial Great Saphenous Vein: No evidence of thrombus. Normal
compressibility and flow on color Doppler imaging.

Other Findings:  None.

LEFT LOWER EXTREMITY

Common Femoral Vein: Echogenic nonocclusive thrombus. Partial
compressibility noted with respiratory phasicity and response to
Valsalva.

Saphenofemoral Junction: Echogenic thrombus, partially compressible
and nonocclusive.

Profunda Femoral Vein: No evidence of thrombus. Normal
compressibility and flow on color Doppler imaging.

Femoral Vein: Echogenic thrombus from proximal to distal femoral
vein. Partial compressibility, respiratory phasicity and response to
augmentation.

Popliteal Vein: Echogenic thrombus without occlusion. Partial
compressibility, respiratory phasicity and response to augmentation.

Calf Veins: Compressible and patent posterior tibial vein with color
Doppler demonstrated within. Suboptimal visualization of the
peroneal veins unable to rule out nonocclusive thrombus.

Other Findings:  None.
IMPRESSION: Bilateral chronic appearing nonocclusive, partially compressible
deep vein thrombosis of both lower extremities as above described.
On the right these involve the femoral and popliteal veins as well
as posterior tibial veins and on the left from common femoral
through popliteal veins. The peroneal veins of the calves are not
well visualized and thrombosis is not excluded.

Critical Value/emergent results were called by telephone at the time
of interpretation on 02/16/2017 at [DATE] to Dr. Locklear, who
verbally acknowledged these results.
